# Patient Record
Sex: Male | Born: 1935 | Race: White | Hispanic: No | Marital: Married | Smoking: Never smoker
Health system: Southern US, Community
[De-identification: ages and names within clinical notes are randomized; demographics above are authoritative.]

## PROBLEM LIST (undated history)

## (undated) DIAGNOSIS — K219 Gastro-esophageal reflux disease without esophagitis: Secondary | ICD-10-CM

## (undated) DIAGNOSIS — E7800 Pure hypercholesterolemia, unspecified: Secondary | ICD-10-CM

## (undated) DIAGNOSIS — E119 Type 2 diabetes mellitus without complications: Secondary | ICD-10-CM

## (undated) DIAGNOSIS — I10 Essential (primary) hypertension: Secondary | ICD-10-CM

## (undated) DIAGNOSIS — N419 Inflammatory disease of prostate, unspecified: Secondary | ICD-10-CM

## (undated) HISTORY — PX: HERNIA REPAIR: SHX51

## (undated) HISTORY — PX: CHOLECYSTECTOMY: SHX55

---

## 2012-10-19 ENCOUNTER — Other Ambulatory Visit: Payer: Self-pay

## 2012-10-19 ENCOUNTER — Emergency Department (HOSPITAL_BASED_OUTPATIENT_CLINIC_OR_DEPARTMENT_OTHER)
Admission: EM | Admit: 2012-10-19 | Discharge: 2012-10-19 | Disposition: A | Discharge status: Home / Self Care | Payer: Medicare Other | Attending: Emergency Medicine | Admitting: Emergency Medicine

## 2012-10-19 ENCOUNTER — Encounter (HOSPITAL_BASED_OUTPATIENT_CLINIC_OR_DEPARTMENT_OTHER): Payer: Self-pay | Admitting: *Deleted

## 2012-10-19 ENCOUNTER — Emergency Department (HOSPITAL_BASED_OUTPATIENT_CLINIC_OR_DEPARTMENT_OTHER): Payer: Medicare Other

## 2012-10-19 DIAGNOSIS — E119 Type 2 diabetes mellitus without complications: Secondary | ICD-10-CM | POA: Insufficient documentation

## 2012-10-19 DIAGNOSIS — Z8669 Personal history of other diseases of the nervous system and sense organs: Secondary | ICD-10-CM | POA: Insufficient documentation

## 2012-10-19 DIAGNOSIS — Z79899 Other long term (current) drug therapy: Secondary | ICD-10-CM | POA: Insufficient documentation

## 2012-10-19 DIAGNOSIS — I10 Essential (primary) hypertension: Secondary | ICD-10-CM | POA: Insufficient documentation

## 2012-10-19 DIAGNOSIS — I4510 Unspecified right bundle-branch block: Secondary | ICD-10-CM | POA: Insufficient documentation

## 2012-10-19 DIAGNOSIS — E7800 Pure hypercholesterolemia, unspecified: Secondary | ICD-10-CM | POA: Insufficient documentation

## 2012-10-19 DIAGNOSIS — K219 Gastro-esophageal reflux disease without esophagitis: Secondary | ICD-10-CM | POA: Insufficient documentation

## 2012-10-19 DIAGNOSIS — R002 Palpitations: Secondary | ICD-10-CM | POA: Insufficient documentation

## 2012-10-19 DIAGNOSIS — Z87448 Personal history of other diseases of urinary system: Secondary | ICD-10-CM | POA: Insufficient documentation

## 2012-10-19 DIAGNOSIS — R42 Dizziness and giddiness: Secondary | ICD-10-CM | POA: Insufficient documentation

## 2012-10-19 HISTORY — DX: Type 2 diabetes mellitus without complications: E11.9

## 2012-10-19 HISTORY — DX: Gastro-esophageal reflux disease without esophagitis: K21.9

## 2012-10-19 HISTORY — DX: Essential (primary) hypertension: I10

## 2012-10-19 HISTORY — DX: Pure hypercholesterolemia, unspecified: E78.00

## 2012-10-19 HISTORY — DX: Inflammatory disease of prostate, unspecified: N41.9

## 2012-10-19 LAB — CBC WITH DIFFERENTIAL/PLATELET
Basophils Absolute: 0 10*3/uL (ref 0.0–0.1)
Basophils Relative: 0 % (ref 0–1)
Eosinophils Relative: 1 % (ref 0–5)
HCT: 40.5 % (ref 39.0–52.0)
Lymphocytes Relative: 43 % (ref 12–46)
MCHC: 35.3 g/dL (ref 30.0–36.0)
MCV: 85.1 fL (ref 78.0–100.0)
Monocytes Absolute: 0.8 10*3/uL (ref 0.1–1.0)
RDW: 13.6 % (ref 11.5–15.5)

## 2012-10-19 LAB — COMPREHENSIVE METABOLIC PANEL
Albumin: 3.7 g/dL (ref 3.5–5.2)
BUN: 27 mg/dL — ABNORMAL HIGH (ref 6–23)
Chloride: 100 mEq/L (ref 96–112)
Creatinine, Ser: 1.1 mg/dL (ref 0.50–1.35)
GFR calc Af Amer: 71 mL/min — ABNORMAL LOW (ref >90)
Glucose, Bld: 157 mg/dL — ABNORMAL HIGH (ref 70–99)
Total Bilirubin: 0.7 mg/dL (ref 0.3–1.2)

## 2012-10-19 MED ORDER — METOPROLOL SUCCINATE ER 25 MG PO TB24: 25.0000 mg | ORAL_TABLET | Freq: Every day | ORAL | Status: DC

## 2012-10-19 MED ORDER — METOPROLOL TARTRATE 50 MG PO TABS
25.0000 mg | ORAL_TABLET | Freq: Once | ORAL | Status: AC
  Administered 2012-10-19: 25 mg via ORAL
  Filled 2012-10-19: qty 1

## 2012-10-19 NOTE — ED Notes (Signed)
Pt states he has intermittent periods of palp since yest. Verified at pharmacy and told to have checked. Went to [facility] and told to come to ED. They came here last p.m., but we were full , so they left and it happened again today. No other s/s. Dizzy during episodes. Thirsty.

## 2012-10-19 NOTE — ED Provider Notes (Signed)
History    This chart was scribed for Nelia Shi, MD by Toya Smothers, ED Scribe. The patient was seen in room MH02/MH02. Patient's care was started at 1847.  CSN: [id number]  Arrival date & time 10/19/12  1847   First MD Initiated Contact with Patient 10/19/12 1904      Chief Complaint  Patient presents with  . Palpitations    The history is provided by the patient and the spouse. No language interpreter was used.    Jeremy Stuart is a 79 y.o. male with h/o HTN, hypercholesteremia, DM w/o complication, tinnitus, and vertigo, who presents to the Emergency Department complaining of 24 hours of gradual onset, intermittent episodes of moderate palpation. Pt denies pain. Typically healthy, CC is a deviation from baseline. Prior to onset yesterday, Pt denotes lightheadedness and feeling "panicky." Pt's recorded heart rate at 168 BPM via blood pressure monitor. Pt went to [facility] for verification and upon evaluation CC subsided. Pt was referred to the ED for further evaluation. 2 hours PTA today, Pt experienced a second episode with similar precursors and a heart rate of 168 BMP. Symptoms have not been treated PTA. No fever, chills, cough, congestion, rhinorrhea, chest pain, SOB, or n/v/d. Pt denies use of tobacco, alcohol, and illicit drug use.   Past Medical History  Diagnosis Date  . Hypertension   . Hypercholesteremia   . Diabetes mellitus without complication   . Acid reflux   . Prostate infection     Past Surgical History  Procedure Laterality Date  . Cholecystectomy    . Hernia repair      History reviewed. No pertinent family history.  History  Substance Use Topics  . Smoking status: Never Smoker   . Smokeless tobacco: Not on file  . Alcohol Use: No      Review of Systems  Cardiovascular: Positive for palpitations. Negative for chest pain.  Neurological: Positive for light-headedness.  All other systems reviewed and are negative.    Allergies   Ciprofloxacin; Penicillins; and Sulfa antibiotics  Home Medications   Current Outpatient Rx  Name  Route  Sig  Dispense  Refill  . calcium carbonate (OS-CAL) 600 MG TABS   Oral   Take 600 mg by mouth 2 (two) times daily with a meal.         . clonazePAM (KLONOPIN) 2 MG tablet   Oral   Take 2 mg by mouth 2 (two) times daily as needed for anxiety.         Marland Kitchen ezetimibe (ZETIA) 10 MG tablet   Oral   Take 10 mg by mouth daily.         . hydrochlorothiazide (HYDRODIURIL) 25 MG tablet   Oral   Take 25 mg by mouth daily.         Marland Kitchen lovastatin (MEVACOR) 20 MG tablet   Oral   Take 20 mg by mouth at bedtime.         . metFORMIN (GLUCOPHAGE) 500 MG tablet   Oral   Take 500 mg by mouth 2 (two) times daily with a meal.         . omeprazole (PRILOSEC) 10 MG capsule   Oral   Take 10 mg by mouth daily.         . metoprolol succinate (TOPROL-XL) 25 MG 24 hr tablet   Oral   Take 1 tablet (25 mg total) by mouth daily.   14 tablet   0  BP 132/79  Pulse 108  Temp(Src) 97.8 F (36.6 C) (Oral)  Resp 20  Ht 5\' 5"  (1.651 m)  Wt 156 lb (70.761 kg)  BMI 25.96 kg/m2  SpO2 100%  Physical Exam  Nursing note and vitals reviewed. Constitutional: He is oriented to person, place, and time. He appears well-developed and well-nourished. No distress.  HENT:  Head: Normocephalic and atraumatic.  Eyes: Pupils are equal, round, and reactive to light.  Neck: Normal range of motion.  Cardiovascular: Normal rate and intact distal pulses.   Pulmonary/Chest: No respiratory distress.  Abdominal: Normal appearance. He exhibits no distension.  Musculoskeletal: Normal range of motion.  Neurological: He is alert and oriented to person, place, and time. No cranial nerve deficit.  Skin: Skin is warm and dry. No rash noted.  Psychiatric: He has a normal mood and affect. His behavior is normal.    ED Course  Procedures DIAGNOSTIC STUDIES: Oxygen Saturation is 100% on room air, normal  by my interpretation.    COORDINATION OF CARE: 19:17- Evaluated Pt. Pt is awake, alert, and without distress. 19:20- Ordered CBC with Differential, Troponin I, and Comprehensive metabolic panel STAT. 19:20- Ordered Telemetry monitoring Until discontinued. 19:21- Ordered DG Chest 2 View 1 time imaging 19:25- Patient understand and agree with initial ED impression and plan with expectations set for ED visit.     Labs Reviewed  COMPREHENSIVE METABOLIC PANEL - Abnormal; Notable for the following:    Glucose, Bld 157 (*)    BUN 27 (*)    GFR calc non Af Amer 61 (*)    GFR calc Af Amer 71 (*)    All other components within normal limits  CBC WITH DIFFERENTIAL  TROPONIN I   Dg Chest 2 View  10/19/2012  *RADIOLOGY REPORT*  Clinical Data: Tachycardia.  CHEST - 2 VIEW  Comparison: None.  Findings: Mild lobulation of the right hemidiaphragm is likely incidental.  Atherosclerotic calcification of the aortic arch noted.  Heart size within normal limits.  Old right-sided rib fractures noted.  The lungs appear clear.  No pleural effusion.  IMPRESSION:  1.  No acute findings. 2.  Mild lobulation of the right hemidiaphragm, likely incidental.   Original Report Authenticated By: Gaylyn Rong, M.D.      1. Palpitation   2. RBBB       MDM  I personally performed the services described in this documentation, which was scribed in my presence. The recorded information has been reviewed and considered.   At time of discharge patient's heart rate was down to 75.  He was resting comfortably with no complaints.  Patient was given instructions to followup with his family doctor tomorrow for in order to set up a Holter or event monitor.   Nelia Shi, MD 10/19/12 2052

## 2014-12-13 ENCOUNTER — Inpatient Hospital Stay (HOSPITAL_BASED_OUTPATIENT_CLINIC_OR_DEPARTMENT_OTHER)
Admission: EM | Admit: 2014-12-13 | Discharge: 2014-12-16 | DRG: 872 | Disposition: A | Discharge status: Home / Self Care | Payer: Medicare Other | Attending: Internal Medicine | Admitting: Internal Medicine

## 2014-12-13 ENCOUNTER — Encounter (HOSPITAL_BASED_OUTPATIENT_CLINIC_OR_DEPARTMENT_OTHER): Payer: Self-pay | Admitting: *Deleted

## 2014-12-13 ENCOUNTER — Emergency Department (HOSPITAL_BASED_OUTPATIENT_CLINIC_OR_DEPARTMENT_OTHER): Payer: Medicare Other

## 2014-12-13 DIAGNOSIS — R7989 Other specified abnormal findings of blood chemistry: Secondary | ICD-10-CM | POA: Insufficient documentation

## 2014-12-13 DIAGNOSIS — R1031 Right lower quadrant pain: Secondary | ICD-10-CM

## 2014-12-13 DIAGNOSIS — E119 Type 2 diabetes mellitus without complications: Secondary | ICD-10-CM | POA: Diagnosis present

## 2014-12-13 DIAGNOSIS — E785 Hyperlipidemia, unspecified: Secondary | ICD-10-CM | POA: Diagnosis present

## 2014-12-13 DIAGNOSIS — E7800 Pure hypercholesterolemia, unspecified: Secondary | ICD-10-CM | POA: Diagnosis present

## 2014-12-13 DIAGNOSIS — K219 Gastro-esophageal reflux disease without esophagitis: Secondary | ICD-10-CM | POA: Diagnosis present

## 2014-12-13 DIAGNOSIS — I4510 Unspecified right bundle-branch block: Secondary | ICD-10-CM | POA: Diagnosis present

## 2014-12-13 DIAGNOSIS — E872 Acidosis: Secondary | ICD-10-CM | POA: Diagnosis present

## 2014-12-13 DIAGNOSIS — A419 Sepsis, unspecified organism: Principal | ICD-10-CM | POA: Diagnosis present

## 2014-12-13 DIAGNOSIS — R112 Nausea with vomiting, unspecified: Secondary | ICD-10-CM

## 2014-12-13 DIAGNOSIS — T502X5A Adverse effect of carbonic-anhydrase inhibitors, benzothiadiazides and other diuretics, initial encounter: Secondary | ICD-10-CM | POA: Diagnosis present

## 2014-12-13 DIAGNOSIS — R109 Unspecified abdominal pain: Secondary | ICD-10-CM | POA: Diagnosis present

## 2014-12-13 DIAGNOSIS — Z880 Allergy status to penicillin: Secondary | ICD-10-CM

## 2014-12-13 DIAGNOSIS — R52 Pain, unspecified: Secondary | ICD-10-CM

## 2014-12-13 DIAGNOSIS — E876 Hypokalemia: Secondary | ICD-10-CM | POA: Diagnosis present

## 2014-12-13 DIAGNOSIS — Z66 Do not resuscitate: Secondary | ICD-10-CM | POA: Diagnosis present

## 2014-12-13 DIAGNOSIS — I10 Essential (primary) hypertension: Secondary | ICD-10-CM | POA: Diagnosis present

## 2014-12-13 DIAGNOSIS — Z79899 Other long term (current) drug therapy: Secondary | ICD-10-CM

## 2014-12-13 DIAGNOSIS — K529 Noninfective gastroenteritis and colitis, unspecified: Secondary | ICD-10-CM | POA: Diagnosis present

## 2014-12-13 DIAGNOSIS — Z881 Allergy status to other antibiotic agents status: Secondary | ICD-10-CM

## 2014-12-13 LAB — CBC WITH DIFFERENTIAL/PLATELET
BASOS ABS: 0 10*3/uL (ref 0.0–0.1)
BASOS PCT: 0 % (ref 0–1)
EOS ABS: 0 10*3/uL (ref 0.0–0.7)
Eosinophils Relative: 0 % (ref 0–5)
HEMATOCRIT: 43.5 % (ref 39.0–52.0)
HEMOGLOBIN: 15.1 g/dL (ref 13.0–17.0)
LYMPHS ABS: 0.6 10*3/uL — AB (ref 0.7–4.0)
Lymphocytes Relative: 5 % — ABNORMAL LOW (ref 12–46)
MCH: 30 pg (ref 26.0–34.0)
MCHC: 34.7 g/dL (ref 30.0–36.0)
MCV: 86.5 fL (ref 78.0–100.0)
Monocytes Absolute: 0.6 10*3/uL (ref 0.1–1.0)
Monocytes Relative: 5 % (ref 3–12)
NEUTROS PCT: 90 % — AB (ref 43–77)
Neutro Abs: 10.5 10*3/uL — ABNORMAL HIGH (ref 1.7–7.7)
PLATELETS: 282 10*3/uL (ref 150–400)
RBC: 5.03 MIL/uL (ref 4.22–5.81)
RDW: 14 % (ref 11.5–15.5)
WBC: 11.8 10*3/uL — ABNORMAL HIGH (ref 4.0–10.5)

## 2014-12-13 LAB — HEPATIC FUNCTION PANEL
ALBUMIN: 4.2 g/dL (ref 3.5–5.0)
ALK PHOS: 51 U/L (ref 38–126)
ALT: 32 U/L (ref 17–63)
AST: 31 U/L (ref 15–41)
BILIRUBIN DIRECT: 0.3 mg/dL (ref 0.1–0.5)
BILIRUBIN TOTAL: 1.3 mg/dL — AB (ref 0.3–1.2)
Indirect Bilirubin: 1 mg/dL — ABNORMAL HIGH (ref 0.3–0.9)
Total Protein: 7.6 g/dL (ref 6.5–8.1)

## 2014-12-13 LAB — URINALYSIS, ROUTINE W REFLEX MICROSCOPIC
BILIRUBIN URINE: NEGATIVE
GLUCOSE, UA: NEGATIVE mg/dL
KETONES UR: 15 mg/dL — AB
Leukocytes, UA: NEGATIVE
Nitrite: NEGATIVE
PROTEIN: NEGATIVE mg/dL
Specific Gravity, Urine: 1.023 (ref 1.005–1.030)
UROBILINOGEN UA: 0.2 mg/dL (ref 0.0–1.0)
pH: 5 (ref 5.0–8.0)

## 2014-12-13 LAB — BASIC METABOLIC PANEL
Anion gap: 13 (ref 5–15)
BUN: 23 mg/dL — AB (ref 6–20)
CHLORIDE: 98 mmol/L — AB (ref 101–111)
CO2: 25 mmol/L (ref 22–32)
CREATININE: 1.05 mg/dL (ref 0.61–1.24)
Calcium: 9.5 mg/dL (ref 8.9–10.3)
GFR calc non Af Amer: >60 mL/min (ref >60)
Glucose, Bld: 169 mg/dL — ABNORMAL HIGH (ref 65–99)
POTASSIUM: 3.4 mmol/L — AB (ref 3.5–5.1)
SODIUM: 136 mmol/L (ref 135–145)

## 2014-12-13 LAB — I-STAT CG4 LACTIC ACID, ED: LACTIC ACID, VENOUS: 3.11 mmol/L — AB (ref 0.5–2.0)

## 2014-12-13 LAB — TROPONIN I: Troponin I: 0.03 ng/mL (ref <0.031)

## 2014-12-13 LAB — LIPASE, BLOOD: LIPASE: 29 U/L (ref 22–51)

## 2014-12-13 LAB — URINE MICROSCOPIC-ADD ON

## 2014-12-13 MED ORDER — SODIUM CHLORIDE 0.9 % IV BOLUS (SEPSIS)
500.0000 mL | Freq: Once | INTRAVENOUS | Status: AC
  Administered 2014-12-13: 500 mL via INTRAVENOUS

## 2014-12-13 MED ORDER — IOHEXOL 300 MG/ML  SOLN
25.0000 mL | Freq: Once | INTRAMUSCULAR | Status: AC | PRN
  Administered 2014-12-13: 25 mL via ORAL

## 2014-12-13 MED ORDER — SODIUM CHLORIDE 0.9 % IV BOLUS (SEPSIS)
30.0000 mL/kg | Freq: Once | INTRAVENOUS | Status: AC
  Administered 2014-12-14: 2274 mL via INTRAVENOUS

## 2014-12-13 MED ORDER — METRONIDAZOLE IN NACL 5-0.79 MG/ML-% IV SOLN
500.0000 mg | Freq: Once | INTRAVENOUS | Status: AC
  Administered 2014-12-13: 500 mg via INTRAVENOUS
  Filled 2014-12-13: qty 100

## 2014-12-13 MED ORDER — IOHEXOL 300 MG/ML  SOLN
100.0000 mL | Freq: Once | INTRAMUSCULAR | Status: AC | PRN
  Administered 2014-12-13: 100 mL via INTRAVENOUS

## 2014-12-13 MED ORDER — ONDANSETRON 4 MG PO TBDP
4.0000 mg | ORAL_TABLET | Freq: Once | ORAL | Status: AC
  Administered 2014-12-13: 4 mg via ORAL
  Filled 2014-12-13: qty 1

## 2014-12-13 MED ORDER — SODIUM CHLORIDE 0.9 % IV SOLN
INTRAVENOUS | Status: AC
  Administered 2014-12-14: 05:00:00 via INTRAVENOUS

## 2014-12-13 NOTE — ED Notes (Signed)
Pt vomited approx 500cc emesis with undigested food- New emesis bag given- ODT zofran ordered per protocol

## 2014-12-13 NOTE — ED Provider Notes (Signed)
CSN: [id number]     Arrival date & time 12/13/14  1839 History   This chart was scribed for Arby BarretteMarcy Ishaq Maffei, MD by Abel PrestoKara Demonbreun, ED Scribe. This patient was seen in room MH05/MH05 and the patient's care was started at 8:05 PM.     Chief Complaint  Patient presents with  . Abdominal Pain     The history is provided by the patient. No language interpreter was used.   HPI Comments: Jeremy Lyonshomas Gangi is a 81 y.o. male with PMHx of HTN, HLD, DM, GERD,  presents to the Emergency Department complaining of periumbilical abdominal pain radiating to right side which has partially resolved with onset around 12 PM. Pt states he at a PB&J sandwich, Coke, and protein bar around 12:30 PM and took a Tylenol. Pt then took 1/2 of a Vicodin with mild relief. He also took 2 TUMS. Pt had kidney stone surgery in March (he denies any complications since). Pt notes associated nausea and vomiting once. He reports the pain started somewhat gradually and then just continued to increase. Now after having vomited his abdominal pain has resolved. He denies he had diarrhea or has constipation. He denies pain with movement.  Pt with h/o cholecystectomy. Pt denies swelling in bilateral LEs, fever, testicular pain, dysuria, and back pain.   Past Medical History  Diagnosis Date  . Hypertension   . Hypercholesteremia   . Diabetes mellitus without complication   . Acid reflux   . Prostate infection    Past Surgical History  Procedure Laterality Date  . Cholecystectomy    . Hernia repair     History reviewed. No pertinent family history. History  Substance Use Topics  . Smoking status: Never Smoker   . Smokeless tobacco: Not on file  . Alcohol Use: No    Review of Systems  Gastrointestinal: Positive for abdominal pain.  10 Systems reviewed and all are negative for acute change except as noted in the HPI.      Allergies  Ciprofloxacin; Penicillins; and Sulfa antibiotics  Home Medications   Prior to Admission  medications   Medication Sig Start Date End Date Taking? Authorizing Provider  calcium carbonate (OS-CAL) 600 MG TABS Take 600 mg by mouth 2 (two) times daily with a meal.    Historical Provider, MD  clonazePAM (KLONOPIN) 2 MG tablet Take 2 mg by mouth 2 (two) times daily as needed for anxiety.    Historical Provider, MD  ezetimibe (ZETIA) 10 MG tablet Take 10 mg by mouth daily.    Historical Provider, MD  hydrochlorothiazide (HYDRODIURIL) 25 MG tablet Take 25 mg by mouth daily.    Historical Provider, MD  lovastatin (MEVACOR) 20 MG tablet Take 20 mg by mouth at bedtime.    Historical Provider, MD  metFORMIN (GLUCOPHAGE) 500 MG tablet Take 500 mg by mouth 2 (two) times daily with a meal.    Historical Provider, MD  metoprolol succinate (TOPROL-XL) 25 MG 24 hr tablet Take 1 tablet (25 mg total) by mouth daily. 10/19/12   Nelva Nayobert Beaton, MD  omeprazole (PRILOSEC) 10 MG capsule Take 10 mg by mouth daily.    Historical Provider, MD   BP 139/73 mmHg  Pulse 108  Temp(Src) 101.1 F (38.4 C) (Oral)  Resp 18  Ht 5\' 4"  (1.626 m)  Wt 167 lb (75.751 kg)  BMI 28.65 kg/m2  SpO2 99% Physical Exam  Constitutional: He is oriented to person, place, and time. He appears well-developed and well-nourished.  HENT:  Head: Normocephalic and  atraumatic.  Eyes: EOM are normal. Pupils are equal, round, and reactive to light.  Neck: Neck supple.  Cardiovascular: Normal rate, regular rhythm, normal heart sounds and intact distal pulses.   Pulmonary/Chest: Effort normal and breath sounds normal.  Abdominal: Soft. Bowel sounds are normal. He exhibits no distension. There is no tenderness.  Musculoskeletal: Normal range of motion. He exhibits no edema.  Neurological: He is alert and oriented to person, place, and time. He has normal strength. Coordination normal. GCS eye subscore is 4. GCS verbal subscore is 5. GCS motor subscore is 6.  Skin: Skin is warm, dry and intact.  Psychiatric: He has a normal mood and affect.     ED Course  Procedures (including critical care time) DIAGNOSTIC STUDIES: Oxygen Saturation is 100% on room air, normal by my interpretation.    COORDINATION OF CARE: 8:12 PM Discussed treatment plan with patient at beside, the patient agrees with the plan and has no further questions at this time.   Labs Review Labs Reviewed  CBC WITH DIFFERENTIAL/PLATELET - Abnormal; Notable for the following:    WBC 11.8 (*)    Neutrophils Relative % 90 (*)    Neutro Abs 10.5 (*)    Lymphocytes Relative 5 (*)    Lymphs Abs 0.6 (*)    All other components within normal limits  BASIC METABOLIC PANEL - Abnormal; Notable for the following:    Potassium 3.4 (*)    Chloride 98 (*)    Glucose, Bld 169 (*)    BUN 23 (*)    All other components within normal limits  HEPATIC FUNCTION PANEL - Abnormal; Notable for the following:    Total Bilirubin 1.3 (*)    Indirect Bilirubin 1.0 (*)    All other components within normal limits  URINALYSIS, ROUTINE W REFLEX MICROSCOPIC - Abnormal; Notable for the following:    Hgb urine dipstick TRACE (*)    Ketones, ur 15 (*)    All other components within normal limits  I-STAT CG4 LACTIC ACID, ED - Abnormal; Notable for the following:    Lactic Acid, Venous 3.11 (*)    All other components within normal limits  LIPASE, BLOOD  TROPONIN I  URINE MICROSCOPIC-ADD ON  LACTIC ACID, PLASMA  LACTIC ACID, PLASMA    Imaging Review Ct Abdomen Pelvis W Contrast  12/13/2014   CLINICAL DATA:  Diffuse abdominal pain. Nausea and vomiting for 6 hours. Cholecystectomy.  EXAM: CT ABDOMEN AND PELVIS WITH CONTRAST  TECHNIQUE: Multidetector CT imaging of the abdomen and pelvis was performed using the standard protocol following bolus administration of intravenous contrast.  CONTRAST:  25mL OMNIPAQUE IOHEXOL 300 MG/ML SOLN, OMNIPAQUE IOHEXOL 300 MG/ML SOLN  COMPARISON:  06/18/2014.  FINDINGS: Musculoskeletal: Chronic L1 compression fracture. No new compression fractures  compared to prior exam. Lower lumbar spondylosis. Likely chronic T8 compression fracture.  Lung Bases: Pleural nodularity is present in the RIGHT lower lobe. 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 No comparison chest CT is available and these regions were not seen on previous abdominal CT. Coronary artery atherosclerosis is present. If office based assessment of coronary risk factors has not been performed, it is now recommended.  Liver:  Normal.  Spleen:  Normal.  Gallbladder:  Cholecystectomy.  Common bile duct:  Normal.  Pancreas:  Normal.  Adrenal glands:  Unchanged LEFT adrenal myelolipoma.  Kidneys: Renal enhancement and excretion is within normal limits. LEFT ureter normal. RIGHT ureter normal.  Stomach:  Distended with  contrast.  No thickening or obstruction.  Small bowel:  Within normal limits.  No dilation.  Colon: Fluid levels are present throughout the colon. These are abnormal but nonspecific. No colonic obstruction. No definite mural thickening.  Pelvic Genitourinary: Penile prosthesis reservoir in the abdomen. Urinary bladder appears normal.  Peritoneum: No free air.  No free fluid.  Vascular/lymphatic: Atherosclerosis.  Body Wall: Tiny fat containing periumbilical hernia.  IMPRESSION: 1. Areas of RIGHT pleural thickening, suggestive of pleural plaques but incompletely visible. 2. Atherosclerosis and coronary artery disease. 3. Nonspecific fluid levels throughout the colon. These are most commonly associated with enteric infection. 4. Unchanged LEFT adrenal myelolipoma.   Electronically Signed   By: Andreas NewportGeoffrey  Lamke M.D.   On: 12/13/2014 21:27     EKG Interpretation   Date/Time:  Monday Dec 13 2014 20:49:03 EDT Ventricular Rate:  94 PR Interval:  162 QRS Duration: 118 QT Interval:  426 QTC Calculation: 532 R Axis:   -6 Text Interpretation:  Normal sinus rhythm Possible Left atrial enlargement  Right bundle branch block Inferior infarct , age  undetermined T wave  abnormality, consider lateral ischemia Abnormal ECG old RBBB, baseline  artifact Confirmed by Donnald GarrePfeiffer, MD, Lebron ConnersMarcy [phone]([id number]) on 12/13/2014 9:46:22 PM     8:12 PM Pt reports pain has improved post-emesis.  After initial discussion for admission the patient had [facility] Due to bed unavailability, the patient was ultimately consults [facility]   Final diagnoses:  Right lower quadrant abdominal pain  Non-intractable vomiting with nausea, vomiting of unspecified type    Patient has developed quite rapid onset of abdominal pain and vomiting. After having had an episode of emesis the patient reported the pain as having resolved. CT does however show multiple fluid levels and the patient had an elevated lactic acidosis. With the patient's age and tachycardia, concern was for possible early presentation for more serious intra-abdominal process. The patient later developed a fever further raising concern for possible bacterial etiology. The patient will be admitted for ongoing treatment.    Arby BarretteMarcy Murle Otting, MD 12/13/14 [phone]

## 2014-12-13 NOTE — ED Notes (Signed)
Pt c/o diffuse abd pain with n/v/d x 6 hrs

## 2014-12-13 NOTE — ED Notes (Signed)
LAC ran with panic results of 3.11. Results hand delivered to Dr. Donnald GarrePfeiffer @ 2030.

## 2014-12-14 ENCOUNTER — Encounter (HOSPITAL_COMMUNITY): Payer: Self-pay | Admitting: Internal Medicine

## 2014-12-14 DIAGNOSIS — E119 Type 2 diabetes mellitus without complications: Secondary | ICD-10-CM

## 2014-12-14 DIAGNOSIS — R1031 Right lower quadrant pain: Secondary | ICD-10-CM | POA: Diagnosis not present

## 2014-12-14 DIAGNOSIS — K210 Gastro-esophageal reflux disease with esophagitis: Secondary | ICD-10-CM

## 2014-12-14 DIAGNOSIS — E785 Hyperlipidemia, unspecified: Secondary | ICD-10-CM | POA: Diagnosis present

## 2014-12-14 DIAGNOSIS — I10 Essential (primary) hypertension: Secondary | ICD-10-CM | POA: Diagnosis present

## 2014-12-14 DIAGNOSIS — K219 Gastro-esophageal reflux disease without esophagitis: Secondary | ICD-10-CM | POA: Diagnosis present

## 2014-12-14 DIAGNOSIS — E872 Acidosis: Secondary | ICD-10-CM | POA: Diagnosis present

## 2014-12-14 DIAGNOSIS — R109 Unspecified abdominal pain: Secondary | ICD-10-CM | POA: Diagnosis not present

## 2014-12-14 DIAGNOSIS — E876 Hypokalemia: Secondary | ICD-10-CM | POA: Diagnosis present

## 2014-12-14 DIAGNOSIS — E7800 Pure hypercholesterolemia, unspecified: Secondary | ICD-10-CM | POA: Diagnosis present

## 2014-12-14 DIAGNOSIS — Z880 Allergy status to penicillin: Secondary | ICD-10-CM | POA: Diagnosis not present

## 2014-12-14 DIAGNOSIS — A419 Sepsis, unspecified organism: Secondary | ICD-10-CM | POA: Diagnosis present

## 2014-12-14 DIAGNOSIS — Z881 Allergy status to other antibiotic agents status: Secondary | ICD-10-CM | POA: Diagnosis not present

## 2014-12-14 DIAGNOSIS — K529 Noninfective gastroenteritis and colitis, unspecified: Secondary | ICD-10-CM | POA: Diagnosis present

## 2014-12-14 DIAGNOSIS — Z66 Do not resuscitate: Secondary | ICD-10-CM | POA: Diagnosis present

## 2014-12-14 DIAGNOSIS — Z79899 Other long term (current) drug therapy: Secondary | ICD-10-CM | POA: Diagnosis not present

## 2014-12-14 DIAGNOSIS — I4510 Unspecified right bundle-branch block: Secondary | ICD-10-CM | POA: Diagnosis present

## 2014-12-14 DIAGNOSIS — T502X5A Adverse effect of carbonic-anhydrase inhibitors, benzothiadiazides and other diuretics, initial encounter: Secondary | ICD-10-CM | POA: Diagnosis present

## 2014-12-14 LAB — CBC
HCT: 36.5 % — ABNORMAL LOW (ref 39.0–52.0)
Hemoglobin: 12.3 g/dL — ABNORMAL LOW (ref 13.0–17.0)
MCH: 29.2 pg (ref 26.0–34.0)
MCHC: 33.7 g/dL (ref 30.0–36.0)
MCV: 86.7 fL (ref 78.0–100.0)
Platelets: 230 10*3/uL (ref 150–400)
RBC: 4.21 MIL/uL — ABNORMAL LOW (ref 4.22–5.81)
RDW: 14.2 % (ref 11.5–15.5)
WBC: 6.4 10*3/uL (ref 4.0–10.5)

## 2014-12-14 LAB — COMPREHENSIVE METABOLIC PANEL
ALK PHOS: 38 U/L (ref 38–126)
ALT: 24 U/L (ref 17–63)
AST: 22 U/L (ref 15–41)
Albumin: 2.7 g/dL — ABNORMAL LOW (ref 3.5–5.0)
Anion gap: 8 (ref 5–15)
BUN: 15 mg/dL (ref 6–20)
CHLORIDE: 104 mmol/L (ref 101–111)
CO2: 25 mmol/L (ref 22–32)
Calcium: 7.8 mg/dL — ABNORMAL LOW (ref 8.9–10.3)
Creatinine, Ser: 1.03 mg/dL (ref 0.61–1.24)
GFR calc non Af Amer: >60 mL/min (ref >60)
GLUCOSE: 136 mg/dL — AB (ref 65–99)
POTASSIUM: 3.5 mmol/L (ref 3.5–5.1)
Sodium: 137 mmol/L (ref 135–145)
Total Bilirubin: 1.1 mg/dL (ref 0.3–1.2)
Total Protein: 5.1 g/dL — ABNORMAL LOW (ref 6.5–8.1)

## 2014-12-14 LAB — GLUCOSE, CAPILLARY
GLUCOSE-CAPILLARY: 125 mg/dL — AB (ref 65–99)
GLUCOSE-CAPILLARY: 135 mg/dL — AB (ref 65–99)
Glucose-Capillary: 117 mg/dL — ABNORMAL HIGH (ref 65–99)

## 2014-12-14 LAB — PROTIME-INR
INR: 1.11 (ref 0.00–1.49)
Prothrombin Time: 14.4 seconds (ref 11.6–15.2)

## 2014-12-14 LAB — LACTIC ACID, PLASMA
Lactic Acid, Venous: 1.5 mmol/L (ref 0.5–2.0)
Lactic Acid, Venous: 1.5 mmol/L (ref 0.5–2.0)

## 2014-12-14 LAB — CLOSTRIDIUM DIFFICILE BY PCR: Toxigenic C. Difficile by PCR: NEGATIVE

## 2014-12-14 LAB — PROCALCITONIN: Procalcitonin: 0.27 ng/mL

## 2014-12-14 LAB — I-STAT CG4 LACTIC ACID, ED: Lactic Acid, Venous: 1.7 mmol/L (ref 0.5–2.0)

## 2014-12-14 LAB — APTT: APTT: 30 s (ref 24–37)

## 2014-12-14 MED ORDER — ACETAMINOPHEN 325 MG PO TABS
ORAL_TABLET | ORAL | Status: AC
  Administered 2014-12-14: 650 mg
  Filled 2014-12-14: qty 2

## 2014-12-14 MED ORDER — CLONAZEPAM 1 MG PO TABS: 2.0000 mg | ORAL_TABLET | Freq: Two times a day (BID) | ORAL | Status: DC | PRN

## 2014-12-14 MED ORDER — EZETIMIBE 10 MG PO TABS
10.0000 mg | ORAL_TABLET | Freq: Every day | ORAL | Status: DC
  Administered 2014-12-14 – 2014-12-16 (×3): 10 mg via ORAL
  Filled 2014-12-14 (×3): qty 1

## 2014-12-14 MED ORDER — POTASSIUM CHLORIDE 10 MEQ/100ML IV SOLN
10.0000 meq | INTRAVENOUS | Status: AC
  Administered 2014-12-14 (×2): 10 meq via INTRAVENOUS
  Filled 2014-12-14 (×2): qty 100

## 2014-12-14 MED ORDER — ALUM & MAG HYDROXIDE-SIMETH 200-200-20 MG/5ML PO SUSP
30.0000 mL | Freq: Four times a day (QID) | ORAL | Status: DC | PRN
  Filled 2014-12-14: qty 30

## 2014-12-14 MED ORDER — MORPHINE SULFATE 2 MG/ML IJ SOLN
2.0000 mg | INTRAMUSCULAR | Status: DC | PRN
  Administered 2014-12-14: 2 mg via INTRAVENOUS
  Filled 2014-12-14: qty 1

## 2014-12-14 MED ORDER — ACETAMINOPHEN 325 MG PO TABS: 650.0000 mg | ORAL_TABLET | Freq: Four times a day (QID) | ORAL | Status: DC | PRN

## 2014-12-14 MED ORDER — MORPHINE SULFATE 4 MG/ML IJ SOLN
4.0000 mg | Freq: Once | INTRAMUSCULAR | Status: AC
  Administered 2014-12-14: 4 mg via INTRAVENOUS
  Filled 2014-12-14: qty 1

## 2014-12-14 MED ORDER — ONDANSETRON 4 MG PO TBDP
4.0000 mg | ORAL_TABLET | Freq: Three times a day (TID) | ORAL | Status: DC | PRN
  Administered 2014-12-14: 4 mg via ORAL
  Filled 2014-12-14 (×5): qty 1

## 2014-12-14 MED ORDER — SODIUM CHLORIDE 0.9 % IV SOLN
250.0000 mg | Freq: Four times a day (QID) | INTRAVENOUS | Status: DC
  Administered 2014-12-14 – 2014-12-15 (×6): 250 mg via INTRAVENOUS
  Filled 2014-12-14 (×10): qty 250

## 2014-12-14 MED ORDER — HEPARIN SODIUM (PORCINE) 5000 UNIT/ML IJ SOLN
5000.0000 [IU] | Freq: Three times a day (TID) | INTRAMUSCULAR | Status: DC
  Administered 2014-12-14 – 2014-12-16 (×7): 5000 [IU] via SUBCUTANEOUS
  Filled 2014-12-14 (×10): qty 1

## 2014-12-14 MED ORDER — PRAVASTATIN SODIUM 20 MG PO TABS
20.0000 mg | ORAL_TABLET | Freq: Every day | ORAL | Status: DC
  Administered 2014-12-14 – 2014-12-15 (×2): 20 mg via ORAL
  Filled 2014-12-14 (×3): qty 1

## 2014-12-14 MED ORDER — FAMOTIDINE 20 MG PO TABS
20.0000 mg | ORAL_TABLET | Freq: Every day | ORAL | Status: DC
  Administered 2014-12-14 – 2014-12-16 (×3): 20 mg via ORAL
  Filled 2014-12-14 (×3): qty 1

## 2014-12-14 MED ORDER — SODIUM CHLORIDE 0.9 % IV SOLN
500.0000 mg | Freq: Once | INTRAVENOUS | Status: AC
  Administered 2014-12-14: 500 mg via INTRAVENOUS
  Filled 2014-12-14: qty 500

## 2014-12-14 MED ORDER — SODIUM CHLORIDE 0.9 % IV SOLN
INTRAVENOUS | Status: DC
  Administered 2014-12-15 (×2): via INTRAVENOUS

## 2014-12-14 MED ORDER — METOPROLOL SUCCINATE ER 25 MG PO TB24
25.0000 mg | ORAL_TABLET | Freq: Every day | ORAL | Status: DC
  Administered 2014-12-14 – 2014-12-16 (×3): 25 mg via ORAL
  Filled 2014-12-14 (×3): qty 1

## 2014-12-14 MED ORDER — CALCIUM CARBONATE 1250 (500 CA) MG PO TABS
1250.0000 mg | ORAL_TABLET | Freq: Two times a day (BID) | ORAL | Status: DC
  Administered 2014-12-14 – 2014-12-16 (×5): 1250 mg via ORAL
  Filled 2014-12-14 (×7): qty 1

## 2014-12-14 MED ORDER — ACETAMINOPHEN 650 MG RE SUPP: 650.0000 mg | Freq: Four times a day (QID) | RECTAL | Status: DC | PRN

## 2014-12-14 MED ORDER — METRONIDAZOLE IN NACL 5-0.79 MG/ML-% IV SOLN
500.0000 mg | Freq: Three times a day (TID) | INTRAVENOUS | Status: DC
  Administered 2014-12-14 – 2014-12-15 (×4): 500 mg via INTRAVENOUS
  Filled 2014-12-14 (×7): qty 100

## 2014-12-14 NOTE — Progress Notes (Signed)
[facility] PROGRESS NOTE   Jeremy Stuart GNF:[id number] DOB: [birth year] DOA: 12/13/2014 PCP: No primary care provider on file.  HPI/Subjective: Patient seen with wife at bedside, complaining about being hungry, wants to eat. Denies any nausea or vomiting.  Assessment/Plan: Principal Problem:   Abdominal pain Active Problems:   Hypertension   Hypercholesteremia   Diabetes mellitus without complication   Acid reflux   Sepsis   GERD (gastroesophageal reflux disease)   Hypokalemia   Right lower quadrant abdominal pain   This is a no charge note, patient seen earlier today by my colleague Dr. Clyde LundborgNiu. Patient came into the hospital with nausea, vomiting, abdominal pain and fever. Symptoms consistent with sepsis from likely abdominal process. CT scan showed possible enteritis, placed nothing by mouth but he is hungry, will allow regular diet. Patient is on antibiotics, leukocytosis resolved, if tolerated food during continued to improve without nausea vomiting expected discharge in the morning. Currently on Primaxin and Flagyl  Code Status: DNR Family Communication: Plan discussed with the patient. Disposition Plan: Remains inpatient Diet: Diet Carb Modified Fluid consistency:: Thin; Room service appropriate?: Yes  Consultants:  None  Procedures:  None  Antibiotics:  Primaxin and Flagyl   Objective: Filed Vitals:   12/14/14 1541  BP: 116/55  Pulse:   Temp: 98.2 F (36.8 C)  Resp: 16    Intake/Output Summary (Last 24 hours) at 12/14/14 1608 Last data filed at 12/14/14 1457  Gross per 24 hour  Intake 1435.42 ml  Output   1100 ml  Net 335.42 ml   Filed Weights   12/13/14 1842 12/14/14 0306  Weight: 75.751 kg (167 lb) 68.856 kg (151 lb 12.8 oz)    Exam: General: Alert and awake, oriented x3, not in any acute distress. HEENT: anicteric sclera, pupils reactive to light and accommodation, EOMI CVS: S1-S2 clear, no murmur rubs or gallops Chest: clear  to auscultation bilaterally, no wheezing, rales or rhonchi Abdomen: soft nontender, nondistended, normal bowel sounds, no organomegaly Extremities: no cyanosis, clubbing or edema noted bilaterally Neuro: Cranial nerves II-XII intact, no focal neurological deficits  Data Reviewed: Basic Metabolic Panel:  Recent Labs Lab 12/13/14 1950 12/14/14 0452  NA 136 137  K 3.4* 3.5  CL 98* 104  CO2 25 25  GLUCOSE 169* 136*  BUN 23* 15  CREATININE 1.05 1.03  CALCIUM 9.5 7.8*   Liver Function Tests:  Recent Labs Lab 12/13/14 1950 12/14/14 0452  AST 31 22  ALT 32 24  ALKPHOS 51 38  BILITOT 1.3* 1.1  PROT 7.6 5.1*  ALBUMIN 4.2 2.7*    Recent Labs Lab 12/13/14 1950  LIPASE 29   No results for input(s): AMMONIA in the last 168 hours. CBC:  Recent Labs Lab 12/13/14 1950 12/14/14 0452  WBC 11.8* 6.4  NEUTROABS 10.5*  --   HGB 15.1 12.3*  HCT 43.5 36.5*  MCV 86.5 86.7  PLT 282 230   Cardiac Enzymes:  Recent Labs Lab 12/13/14 1950  TROPONINI 0.03   BNP (last 3 results) No results for input(s): BNP in the last 8760 hours.  ProBNP (last 3 results) No results for input(s): PROBNP in the last 8760 hours.  CBG:  Recent Labs Lab 12/14/14 0318 12/14/14 0512 12/14/14 0809  GLUCAP 125* 117* 135*    Micro Recent Results (from the past 240 hour(s))  Clostridium Difficile by PCR     Status: None   Collection Time: 12/14/14  6:29 AM  Result Value Ref Range Status   C difficile by  pcr NEGATIVE NEGATIVE Final     Studies: Ct Abdomen Pelvis W Contrast  12/13/2014   CLINICAL DATA:  Diffuse abdominal pain. Nausea and vomiting for 6 hours. Cholecystectomy.  EXAM: CT ABDOMEN AND PELVIS WITH CONTRAST  TECHNIQUE: Multidetector CT imaging of the abdomen and pelvis was performed using the standard protocol following bolus administration of intravenous contrast.  CONTRAST:  25mL OMNIPAQUE IOHEXOL 300 MG/ML SOLN, 100mL OMNIPAQUE IOHEXOL 300 MG/ML SOLN  COMPARISON:  06/18/2014.   FINDINGS: Musculoskeletal: Chronic L1 compression fracture. No new compression fractures compared to prior exam. Lower lumbar spondylosis. Likely chronic T8 compression fracture.  Lung Bases: Pleural nodularity is present in the RIGHT lower lobe. 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 No comparison chest CT is available and these regions were not seen on previous abdominal CT. Coronary artery atherosclerosis is present. If office based assessment of coronary risk factors has not been performed, it is now recommended.  Liver:  Normal.  Spleen:  Normal.  Gallbladder:  Cholecystectomy.  Common bile duct:  Normal.  Pancreas:  Normal.  Adrenal glands:  Unchanged LEFT adrenal myelolipoma.  Kidneys: Renal enhancement and excretion is within normal limits. LEFT ureter normal. RIGHT ureter normal.  Stomach:  Distended with contrast.  No thickening or obstruction.  Small bowel:  Within normal limits.  No dilation.  Colon: Fluid levels are present throughout the colon. These are abnormal but nonspecific. No colonic obstruction. No definite mural thickening.  Pelvic Genitourinary: Penile prosthesis reservoir in the abdomen. Urinary bladder appears normal.  Peritoneum: No free air.  No free fluid.  Vascular/lymphatic: Atherosclerosis.  Body Wall: Tiny fat containing periumbilical hernia.  IMPRESSION: 1. Areas of RIGHT pleural thickening, suggestive of pleural plaques but incompletely visible. 2. Atherosclerosis and coronary artery disease. 3. Nonspecific fluid levels throughout the colon. These are most commonly associated with enteric infection. 4. Unchanged LEFT adrenal myelolipoma.   Electronically Signed   By: Andreas NewportGeoffrey  Lamke M.D.   On: 12/13/2014 21:27    Scheduled Meds: . calcium carbonate  1,250 mg Oral BID WC  . ezetimibe  10 mg Oral Daily  . famotidine  20 mg Oral Daily  . heparin  5,000 Units Subcutaneous 3 times per day  . imipenem-cilastatin  250 mg Intravenous  Q6H  . metoprolol succinate  25 mg Oral Daily  . metronidazole  500 mg Intravenous Q8H  . pravastatin  20 mg Oral q1800   Continuous Infusions: . sodium chloride 75 mL/hr at 12/14/14 1457       Time spent: 35 minutes    [facility]ic[facility] LLCELMAHI,Lynniah Janoski A  [facility] Pager [phone] If 7PM-7AM, please contact night-coverage at [email or web address], password [email or web address] 12/14/2014, 4:08 PM  LOS: 0 days

## 2014-12-14 NOTE — Progress Notes (Signed)
Utilization Review completed. Leigh Blas RN BSN CM 

## 2014-12-14 NOTE — H&P (Signed)
[facility] History and Physical  Jeremy Lyonshomas Freels UJW:[id number] DOB: 23-Aug-1929 DOA: 12/13/2014  Referring physician: ED physician PCP: No primary care provider on file.  Specialists:   Chief Complaint: Abdominal pain, nausea, vomiting, diarrhea    HPI: Jeremy Stuart is a 81 y.o. male with PMH of hypertension, hyperlipidemia, diabetes mellitus, GERD, history of prostatitis, who presents with abdominal pain, nausea, vomiting and diarrhea.  Patient reports that he started having abdominal pain at 12:30 p.m. It is located in the right lower quadrant, constant, moderate pain, nonradiating. It is associated with fever and chills. He had vomited 3 times. He had 2 bowel movements with loose stool. He denies recent antibiotics use. No symptoms of UTI. He took a Tylenol and then 1/2 of a Vicodin with only slight relief.  Currently patient denies running nose, ear pain, headaches, cough, chest pain, SOB, dysuria, urgency, frequency, hematuria, skin rashes, joint pain or leg swelling. No unilateral weakness, numbness or tingling sensations. No vision change or hearing loss.  In ED, patient was found to have WBC 11.8, fever with temperature 101.5, tachycardia. Potassium 3.4, renal function okay, negative lipase, negative urinalysis, initial lactate is 3.11. CT-abd/pelvis showed nonspecific fluid levels throughout the colo, which is most commonly associated with enteric infection and unchanged LEFT adrenal myelolipoma. Patient is admitted to inpatient for further evaluation and treatment.  Where does patient live?      At home    Can patient participate in ADLs? some Review of Systems:   General: no fevers, chills, no changes in body weight, poor appetite, has fatigue HEENT: no blurry vision, hearing changes or sore throat Pulm: no dyspnea, coughing, wheezing CV: no chest pain, palpitations, shortness of breath Abd: has nausea, vomiting, abdominal pain, diarrhea. No constipation GU: no dysuria,  hematuria, polyuria Ext: no leg edema Neuro: no unilateral weakness, numbness, or tingling Skin: no rash MSK: No muscle spasm, no deformity, no limitation of range of movement in spin Heme: No easy bruising.  Travel history: No recent long distant travel.  Allergy:  Allergies  Allergen Reactions  . Ciprofloxacin Anaphylaxis  . Penicillins Anaphylaxis  . Sulfa Antibiotics Anaphylaxis    Past Medical History  Diagnosis Date  . Hypertension   . Hypercholesteremia   . Diabetes mellitus without complication   . Acid reflux   . Prostate infection     Past Surgical History  Procedure Laterality Date  . Cholecystectomy    . Hernia repair      Social History:  reports that he has never smoked. He does not have any smokeless tobacco history on file. He reports that he does not drink alcohol or use illicit drugs.  Family History:  Family History  Problem Relation Age of Onset  . Leukemia Sister      Prior to Admission medications   Medication Sig Start Date End Date Taking? Authorizing Provider  calcium carbonate (OS-CAL) 600 MG TABS Take 600 mg by mouth 2 (two) times daily with a meal.    Historical Provider, MD  clonazePAM (KLONOPIN) 2 MG tablet Take 2 mg by mouth 2 (two) times daily as needed for anxiety.    Historical Provider, MD  ezetimibe (ZETIA) 10 MG tablet Take 10 mg by mouth daily.    Historical Provider, MD  hydrochlorothiazide (HYDRODIURIL) 25 MG tablet Take 25 mg by mouth daily.    Historical Provider, MD  lovastatin (MEVACOR) 20 MG tablet Take 20 mg by mouth at bedtime.    Historical Provider, MD  metFORMIN (GLUCOPHAGE) 500  MG tablet Take 500 mg by mouth 2 (two) times daily with a meal.    Historical Provider, MD  metoprolol succinate (TOPROL-XL) 25 MG 24 hr tablet Take 1 tablet (25 mg total) by mouth daily. 10/19/12   Nelva Nay, MD  omeprazole (PRILOSEC) 10 MG capsule Take 10 mg by mouth daily.    Historical Provider, MD    Physical Exam: Filed Vitals:    12/14/14 0030 12/14/14 0105 12/14/14 0130 12/14/14 0306  BP: 107/55  112/50 107/54  Pulse: 94 88 89   Temp: 102.6 F (39.2 C) 101.5 F (38.6 C)  98.8 F (37.1 C)  TempSrc: Oral Oral  Oral  Resp: Height:    5' 4.8" (1.646 m)  Weight:    68.856 kg (151 lb 12.8 oz)  SpO2: 97% 97% 95% 97%   General: Not in acute distress HEENT:       Eyes: PERRL, EOMI, no scleral icterus       ENT: No discharge from the ears and nose, no pharynx injection, no tonsillar enlargement.        Neck: No JVD, no bruit, no mass felt. Heme: No neck or axillary lymph node enlargement Cardiac: S1/S2, RRR, tachycardia, No murmurs, No gallops or rubs Pulm: Good air movement bilaterally. Clear to auscultation bilaterally. No rales, wheezing, rhonchi or rubs. Abd: Soft, nondistended, tenderness over RLQ, no rebound pain, no organomegaly, BS present Ext: No edema bilaterally. 2+DP/PT pulse bilaterally Musculoskeletal: No joint deformities, erythema, or stiffness, ROM full Skin: No rashes.  Neuro: Alert, oriented X3, cranial nerves II-XII grossly intact, muscle strength 5/5 in all extremities, sensation to light touch intact.  Psych: Patient is not psychotic, no suicidal or hemocidal ideation.  Labs on Admission:  Basic Metabolic Panel:  Recent Labs Lab 12/13/14 1950  NA 136  K 3.4*  CL 98*  CO2 25  GLUCOSE 169*  BUN 23*  CREATININE 1.05  CALCIUM 9.5   Liver Function Tests:  Recent Labs Lab 12/13/14 1950  AST 31  ALT 32  ALKPHOS 51  BILITOT 1.3*  PROT 7.6  ALBUMIN 4.2    Recent Labs Lab 12/13/14 1950  LIPASE 29   No results for input(s): AMMONIA in the last 168 hours. CBC:  Recent Labs Lab 12/13/14 1950  WBC 11.8*  NEUTROABS 10.5*  HGB 15.1  HCT 43.5  MCV 86.5  PLT 282   Cardiac Enzymes:  Recent Labs Lab 12/13/14 1950  TROPONINI 0.03    BNP (last 3 results) No results for input(s): BNP in the last 8760 hours.  ProBNP (last 3 results) No results for  input(s): PROBNP in the last 8760 hours.  CBG:  Recent Labs Lab 12/14/14 0318 12/14/14 0512  GLUCAP 125* 117*    Radiological Exams on Admission: Ct Abdomen Pelvis W Contrast  12/13/2014   CLINICAL DATA:  Diffuse abdominal pain. Nausea and vomiting for 6 hours. Cholecystectomy.  EXAM: CT ABDOMEN AND PELVIS WITH CONTRAST  TECHNIQUE: Multidetector CT imaging of the abdomen and pelvis was performed using the standard protocol following bolus administration of intravenous contrast.  CONTRAST:  25mL OMNIPAQUE IOHEXOL 300 MG/ML SOLN, OMNIPAQUE IOHEXOL 300 MG/ML SOLN  COMPARISON:  06/18/2014.  FINDINGS: Musculoskeletal: Chronic L1 compression fracture. No new compression fractures compared to prior exam. Lower lumbar spondylosis. Likely chronic T8 compression fracture.  Lung Bases: Pleural nodularity is present in the RIGHT lower lobe. 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 No comparison chest CT  is available and these regions were not seen on previous abdominal CT. Coronary artery atherosclerosis is present. If office based assessment of coronary risk factors has not been performed, it is now recommended.  Liver:  Normal.  Spleen:  Normal.  Gallbladder:  Cholecystectomy.  Common bile duct:  Normal.  Pancreas:  Normal.  Adrenal glands:  Unchanged LEFT adrenal myelolipoma.  Kidneys: Renal enhancement and excretion is within normal limits. LEFT ureter normal. RIGHT ureter normal.  Stomach:  Distended with contrast.  No thickening or obstruction.  Small bowel:  Within normal limits.  No dilation.  Colon: Fluid levels are present throughout the colon. These are abnormal but nonspecific. No colonic obstruction. No definite mural thickening.  Pelvic Genitourinary: Penile prosthesis reservoir in the abdomen. Urinary bladder appears normal.  Peritoneum: No free air.  No free fluid.  Vascular/lymphatic: Atherosclerosis.  Body Wall: Tiny fat containing periumbilical  hernia.  IMPRESSION: 1. Areas of RIGHT pleural thickening, suggestive of pleural plaques but incompletely visible. 2. Atherosclerosis and coronary artery disease. 3. Nonspecific fluid levels throughout the colon. These are most commonly associated with enteric infection. 4. Unchanged LEFT adrenal myelolipoma.   Electronically Signed   By: Andreas NewportGeoffrey  Lamke M.D.   On: 12/13/2014 21:27    EKG: Independently reviewed.  Abnormal findings:   Right bundle blockage which existed in previous EKG on 10/19/12. EKG also showed a pattern which is similar to flutter, but looks like artificial effect to me. I will repeat EKG.  Assessment/Plan Principal Problem:   Abdominal pain Active Problems:   Hypertension   Hypercholesteremia   Diabetes mellitus without complication   Acid reflux   Sepsis   GERD (gastroesophageal reflux disease)   Hypokalemia   Right lower quadrant abdominal pain  Abdominal pain, nausea, vomiting, diarrhea: Most likely due to enteritis as suggested by CT abdomen/pelvis. No recent antibiotics use, still need to rule out C. difficile. Currently the patient is septic with leukocytosis, fever and tachycardia. Hemodynamically stable.  -will admit to tele bed -IV Flagyl and Primaxin were started in the emergency room, will continue --will get Procalcitonin and trend lactic acid level per sepsis protocol -IVF: 3.2L of NS bolus in ED, followed by 125 cc/h -Zofran for nausea, and morphine for abdominal pain -Check C. difficile PCR and GI pathogen panel -Hold PPI until C. Difficile pcr result comes back  Sepsis secondary to possible enteritis: -See above  GERD: -hold PPI as above -start pepcid  Diabetes mellitus: No A1c on record. Patient is on metformin at home. -Sliding-scale insulin -Check A1c  Hyperlipidemia: No LDL on record. -Continue Zetia -Switch lovastatin to pravastatin hospital -FLP  Hypertension: -Hold HCTZ since patient needs IV fluid -continue  metoprolol  Hypokalemia: -Repleted  Abnormal EKG: Looks like artificial rather than flutter, though I'm not completely sure. Patient does not have palpitations or chest pain. -Repeat EKG   DVT ppx: SQ Heparin   Code Status: DNR Family Communication: None at bed side.  Disposition Plan: Admit to inpatient   Date of Service 12/14/2014    Lorretta HarpIU, Eveny Anastas [facility] Pager [phone]  If 7PM-7AM, please contact night-coverage [email or web address] Password [email or web address] 12/14/2014, 5:23 AM

## 2014-12-14 NOTE — Progress Notes (Signed)
Placed pt on cpap for the night at 4, pt stated home setting is 2.5 however hospital cpap lowest setting is 4. Pt is tolerating well at this time.

## 2014-12-14 NOTE — Progress Notes (Signed)
OT Cancellation Note  Patient Details Name: Jeremy Stuart MRN: [id number] DOB: 19-Jul-1930   Cancelled Treatment:    Reason Eval/Treat Not Completed: OT screened, no needs identified, will sign off. Spoke with pt and wife and pt has no OT concerns.   Earlie RavelingStraub, Kalayna Noy L OTR/L [phone] 12/14/2014, 1:58 PM

## 2014-12-14 NOTE — Evaluation (Signed)
Physical Therapy Evaluation and Discharge Patient Details Name: Jeremy Stuart MRN: [id number] DOB: 11-Mar-1930 Today's Date: 12/14/2014   History of Present Illness  Pt is an 81 y/o male admitted with abdominal pain, N/V/D. Possible enteritis. PMH includes HTN, hyperlipidemia, DM, GERD, prostatitis.  Clinical Impression  Patient evaluated by Physical Therapy with no further acute PT needs identified. All education has been completed and the patient has no further questions. At the time of PT eval pt was able to perform all mobility at a mod I to independent level with no use of AD. Pt states he is at baseline of function. See below for any follow-up Physial Therapy or equipment needs. PT is signing off. Thank you for this referral.     Follow Up Recommendations No PT follow up    Equipment Recommendations  None recommended by PT    Recommendations for Other Services       Precautions / Restrictions Precautions Precautions: Fall Restrictions Weight Bearing Restrictions: No      Mobility  Bed Mobility Overal bed mobility: Independent                Transfers Overall transfer level: Modified independent Equipment used: None                Ambulation/Gait Ambulation/Gait assistance: Modified independent (Device/Increase time) Ambulation Distance (Feet): 375 Feet Assistive device: None Gait Pattern/deviations: WFL(Within Functional Limits) Gait velocity: Slightly decreased   General Gait Details: Pt ambulated well with no reports of dizziness or lightheadedness. Demonstrated head turns x2 directions with no unsteadiness noted.  Stairs            Wheelchair Mobility    Modified Rankin (Stroke Patients Only)       Balance Overall balance assessment: No apparent balance deficits (not formally assessed)                                           Pertinent Vitals/Pain Pain Assessment: No/denies pain    Home Living Family/patient  expects to be discharged to:: Private residence Living Arrangements: Spouse/significant other Available Help at Discharge: Family Type of Home: House Home Access: Level entry     Home Layout: Two level;Able to live on main level with bedroom/bathroom Home Equipment: Cane - single point;Grab bars - tub/shower      Prior Function Level of Independence: Independent         Comments: Still driving, community ambulator.      Hand Dominance   Dominant Hand: Right    Extremity/Trunk Assessment   Upper Extremity Assessment: Overall WFL for tasks assessed           Lower Extremity Assessment: Overall WFL for tasks assessed      Cervical / Trunk Assessment: Normal (Forward head posture)  Communication   Communication: HOH  Cognition Arousal/Alertness: Awake/alert Behavior During Therapy: WFL for tasks assessed/performed Overall Cognitive Status: Within Functional Limits for tasks assessed                      General Comments General comments (skin integrity, edema, etc.): Pt reports he has vertigo and has been routinely doing exercises to assist with this for the past 5 years. It appears that he has been receiving treatment for some time and that his symptoms are fairly well controlled. No onset of vertigo during session even with head turns during ambulation.  Exercises        Assessment/Plan    PT Assessment Patent does not need any further PT services  PT Diagnosis Difficulty walking   PT Problem List    PT Treatment Interventions     PT Goals (Current goals can be found in the Care Plan section) Acute Rehab PT Goals PT Goal Formulation: All assessment and education complete, DC therapy    Frequency     Barriers to discharge        Co-evaluation               End of Session Equipment Utilized During Treatment: Gait belt Activity Tolerance: Patient tolerated treatment well Patient left: in chair;with call bell/phone within  reach Nurse Communication: Mobility status         Time: 0827-0850 PT Time Calculation (min) (ACUTE ONLY): 23 min   Charges:   PT Evaluation $Initial PT Evaluation Tier I: 1 Procedure PT Treatments $Gait Training: 8-22 mins   PT G Codes:        Conni SlipperKirkman, Janace Decker 12/14/2014, 9:07 AM   Conni SlipperLaura Draxton Luu, PT, DPT Acute Rehabilitation Services Pager: [phone]

## 2014-12-15 ENCOUNTER — Inpatient Hospital Stay (HOSPITAL_COMMUNITY): Payer: Medicare Other

## 2014-12-15 DIAGNOSIS — R112 Nausea with vomiting, unspecified: Secondary | ICD-10-CM

## 2014-12-15 DIAGNOSIS — I10 Essential (primary) hypertension: Secondary | ICD-10-CM

## 2014-12-15 DIAGNOSIS — R109 Unspecified abdominal pain: Secondary | ICD-10-CM

## 2014-12-15 DIAGNOSIS — E119 Type 2 diabetes mellitus without complications: Secondary | ICD-10-CM

## 2014-12-15 DIAGNOSIS — E780 Pure hypercholesterolemia: Secondary | ICD-10-CM

## 2014-12-15 DIAGNOSIS — E876 Hypokalemia: Secondary | ICD-10-CM

## 2014-12-15 DIAGNOSIS — A419 Sepsis, unspecified organism: Principal | ICD-10-CM

## 2014-12-15 LAB — LIPID PANEL
CHOL/HDL RATIO: 3 ratio
CHOLESTEROL: 100 mg/dL (ref 0–200)
HDL: 33 mg/dL — AB (ref >40)
LDL Cholesterol: 52 mg/dL (ref 0–99)
Triglycerides: 73 mg/dL (ref <150)
VLDL: 15 mg/dL (ref 0–40)

## 2014-12-15 LAB — URINALYSIS, ROUTINE W REFLEX MICROSCOPIC
GLUCOSE, UA: NEGATIVE mg/dL
HGB URINE DIPSTICK: NEGATIVE
KETONES UR: 15 mg/dL — AB
Nitrite: NEGATIVE
PROTEIN: 30 mg/dL — AB
Specific Gravity, Urine: 1.023 (ref 1.005–1.030)
Urobilinogen, UA: 1 mg/dL (ref 0.0–1.0)
pH: 5.5 (ref 5.0–8.0)

## 2014-12-15 LAB — BASIC METABOLIC PANEL
Anion gap: 9 (ref 5–15)
BUN: 11 mg/dL (ref 6–20)
CHLORIDE: 104 mmol/L (ref 101–111)
CO2: 23 mmol/L (ref 22–32)
Calcium: 7.6 mg/dL — ABNORMAL LOW (ref 8.9–10.3)
Creatinine, Ser: 0.88 mg/dL (ref 0.61–1.24)
GFR calc Af Amer: >60 mL/min (ref >60)
GFR calc non Af Amer: >60 mL/min (ref >60)
Glucose, Bld: 165 mg/dL — ABNORMAL HIGH (ref 65–99)
Potassium: 3 mmol/L — ABNORMAL LOW (ref 3.5–5.1)
SODIUM: 136 mmol/L (ref 135–145)

## 2014-12-15 LAB — CBC
HCT: 36.2 % — ABNORMAL LOW (ref 39.0–52.0)
HEMOGLOBIN: 12.2 g/dL — AB (ref 13.0–17.0)
MCH: 29.3 pg (ref 26.0–34.0)
MCHC: 33.7 g/dL (ref 30.0–36.0)
MCV: 86.8 fL (ref 78.0–100.0)
Platelets: 183 10*3/uL (ref 150–400)
RBC: 4.17 MIL/uL — AB (ref 4.22–5.81)
RDW: 14.3 % (ref 11.5–15.5)
WBC: 5.4 10*3/uL (ref 4.0–10.5)

## 2014-12-15 LAB — GLUCOSE, CAPILLARY: Glucose-Capillary: 120 mg/dL — ABNORMAL HIGH (ref 65–99)

## 2014-12-15 LAB — MAGNESIUM: MAGNESIUM: 1.2 mg/dL — AB (ref 1.7–2.4)

## 2014-12-15 LAB — HEMOGLOBIN A1C
HEMOGLOBIN A1C: 7.3 % — AB (ref 4.8–5.6)
MEAN PLASMA GLUCOSE: 163 mg/dL

## 2014-12-15 LAB — URINE MICROSCOPIC-ADD ON

## 2014-12-15 MED ORDER — MAGNESIUM SULFATE 4 GM/100ML IV SOLN
4.0000 g | Freq: Once | INTRAVENOUS | Status: AC
  Administered 2014-12-15: 4 g via INTRAVENOUS
  Filled 2014-12-15: qty 100

## 2014-12-15 MED ORDER — POTASSIUM CHLORIDE CRYS ER 20 MEQ PO TBCR
40.0000 meq | EXTENDED_RELEASE_TABLET | ORAL | Status: AC
  Administered 2014-12-15 (×2): 40 meq via ORAL
  Filled 2014-12-15 (×2): qty 2

## 2014-12-15 NOTE — Progress Notes (Signed)
[facility] PROGRESS NOTE  Jeremy Stuart XBJ:[id number] DOB: March 27, 1930 DOA: 12/13/2014 PCP: No primary care provider on file.  Assessment/Plan: #1 abdominal pain/nausea/vomiting/diarrhea Likely secondary to gastroenteritis. Patient was some clinical improvement with no nausea or emesis of further diarrhea. CT abdomen and pelvis consistent with air-fluid levels consistent with an enteritis. C. difficile PCR is negative. On admission patient felt to be septic as he did have a leukocytosis fever and tachycardia. Leukocytosis has results from labs from yesterday. Labs are pending. Blood cultures were no growth to date. Repeat UA with cultures and sensitivities. Patient complaining of right-sided rib pain. Will get rib films to rule out rib fracture. Patient is currently afebrile. Will DC IV Flagyl. Continue Primaxin for now. Supportive care. Tolerating current diet. Follow.  #2 probable sepsis Felt to be secondary to an enteritis. Improved.  #3 gastroesophageal reflux disease Continue Pepcid.  #4 diabetes mellitus Hemoglobin A1c pending. Continue sliding scale insulin.  #5 hyperlipidemia Fasting lipid panel with LDL of 52. Continue Zetia and Pravachol.  #6 hypertension HCTZ on hold. BP stable. Continue beta blocker.  #7 hypokalemia Likely secondary to diuretics. Labs pending.  #8 abnormal EKG Patient asymptomatic. Repeat EKG with right bundle branch block which is old.  #9 prophylaxis Pepcid for GI prophylaxis. Heparin for DVT prophylaxis.  Code Status: DO NOT RESUSCITATE Family Communication: Updated patient no family present. Disposition Plan: Hopefully home in the next 24 hours if continued improvement.   Consultants:  None  Procedures:  CT abdomen and pelvis 12/13/2014  Antibiotics:  IV Flagyl 12/14/2014>>>> 12/15/2014  IV Primaxin 12/14/2014>>>> 12/15/2014  HPI/Subjective: Patient denies any nausea vomiting or diarrhea. Patient states is tolerating current.  Does not like the food. Patient complaining of right-sided pain around his rib area which she states hurt really badly last night and had to be given some morphine for the pain which has subsequently improved. Patient states this pain has been ongoing for the past month.  Objective: Filed Vitals:   12/15/14 0549  BP: 123/81  Pulse: 50  Temp: 98.3 F (36.8 C)  Resp: 18    Intake/Output Summary (Last 24 hours) at 12/15/14 1047 Last data filed at 12/15/14 1024  Gross per 24 hour  Intake 2385.83 ml  Output    550 ml  Net 1835.83 ml   Filed Weights   12/13/14 1842 12/14/14 0306  Weight: 75.751 kg (167 lb) 68.856 kg (151 lb 12.8 oz)    Exam:   General:  NAD  Cardiovascular: RRR  Respiratory: CTAB  Abdomen: Soft, nontender, nondistended, positive bowel sounds.  Musculoskeletal: No clubbing cyanosis or edema. Right rib with minimal tenderness to palpation.  Data Reviewed: Basic Metabolic Panel:  Recent Labs Lab 12/13/14 1950 12/14/14 0452  NA 136 137  K 3.4* 3.5  CL 98* 104  CO2 25 25  GLUCOSE 169* 136*  BUN 23* 15  CREATININE 1.05 1.03  CALCIUM 9.5 7.8*   Liver Function Tests:  Recent Labs Lab 12/13/14 1950 12/14/14 0452  AST 31 22  ALT 32 24  ALKPHOS 51 38  BILITOT 1.3* 1.1  PROT 7.6 5.1*  ALBUMIN 4.2 2.7*    Recent Labs Lab 12/13/14 1950  LIPASE 29   No results for input(s): AMMONIA in the last 168 hours. CBC:  Recent Labs Lab 12/13/14 1950 12/14/14 0452  WBC 11.8* 6.4  NEUTROABS 10.5*  --   HGB 15.1 12.3*  HCT 43.5 36.5*  MCV 86.5 86.7  PLT 282 230   Cardiac Enzymes:  Recent Labs  Lab 12/13/14 1950  TROPONINI 0.03   BNP (last 3 results) No results for input(s): BNP in the last 8760 hours.  ProBNP (last 3 results) No results for input(s): PROBNP in the last 8760 hours.  CBG:  Recent Labs Lab 12/14/14 0318 12/14/14 0512 12/14/14 0809 12/15/14 0734  GLUCAP 125* 117* 135* 120*    Recent Results (from the past 240  hour(s))  Culture, blood (x 2)     Status: None (Preliminary result)   Collection Time: 12/14/14  4:50 AM  Result Value Ref Range Status   Specimen Description BLOOD LEFT ANTECUBITAL  Final   Special Requests BOTTLES DRAWN AEROBIC AND ANAEROBIC  5CC EACH  Final   Culture   Final           BLOOD CULTURE RECEIVED NO GROWTH TO DATE CULTURE WILL BE HELD FOR 5 DAYS BEFORE ISSUING A FINAL NEGATIVE REPORT [facility]    Report Status PENDING  Incomplete  Culture, blood (x 2)     Status: None (Preliminary result)   Collection Time: 12/14/14  4:57 AM  Result Value Ref Range Status   Specimen Description BLOOD RIGHT HAND  Final   Special Requests BOTTLES DRAWN AEROBIC AND ANAEROBIC  5CC EACH  Final   Culture   Final           BLOOD CULTURE RECEIVED NO GROWTH TO DATE CULTURE WILL BE HELD FOR 5 DAYS BEFORE ISSUING A FINAL NEGATIVE REPORT [facility]formed at [facility]    Report Status PENDING  Incomplete  Clostridium Difficile by PCR     Status: None   Collection Time: 12/14/14  6:29 AM  Result Value Ref Range Status   C difficile by pcr NEGATIVE NEGATIVE Final     Studies: Ct Abdomen Pelvis W Contrast  12/13/2014   CLINICAL DATA:  Diffuse abdominal pain. Nausea and vomiting for 6 hours. Cholecystectomy.  EXAM: CT ABDOMEN AND PELVIS WITH CONTRAST  TECHNIQUE: Multidetector CT imaging of the abdomen and pelvis was performed using the standard protocol following bolus administration of intravenous contrast.  CONTRAST:  25mL OMNIPAQUE IOHEXOL 300 MG/ML SOLN, 100mL OMNIPAQUE IOHEXOL 300 MG/ML SOLN  COMPARISON:  06/18/2014.  FINDINGS: Musculoskeletal: Chronic L1 compression fracture. No new compression fractures compared to prior exam. Lower lumbar spondylosis. Likely chronic T8 compression fracture.  Lung Bases: Pleural nodularity is present in the RIGHT lower lobe. 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 No comparison chest CT is  available and these regions were not seen on previous abdominal CT. Coronary artery atherosclerosis is present. If office based assessment of coronary risk factors has not been performed, it is now recommended.  Liver:  Normal.  Spleen:  Normal.  Gallbladder:  Cholecystectomy.  Common bile duct:  Normal.  Pancreas:  Normal.  Adrenal glands:  Unchanged LEFT adrenal myelolipoma.  Kidneys: Renal enhancement and excretion is within normal limits. LEFT ureter normal. RIGHT ureter normal.  Stomach:  Distended with contrast.  No thickening or obstruction.  Small bowel:  Within normal limits.  No dilation.  Colon: Fluid levels are present throughout the colon. These are abnormal but nonspecific. No colonic obstruction. No definite mural thickening.  Pelvic Genitourinary: Penile prosthesis reservoir in the abdomen. Urinary bladder appears normal.  Peritoneum: No free air.  No free fluid.  Vascular/lymphatic: Atherosclerosis.  Body Wall: Tiny fat containing periumbilical hernia.  IMPRESSION: 1. Areas of RIGHT pleural thickening, suggestive of pleural plaques but incompletely visible. 2. Atherosclerosis and coronary  artery disease. 3. Nonspecific fluid levels throughout the colon. These are most commonly associated with enteric infection. 4. Unchanged LEFT adrenal myelolipoma.   Electronically Signed   By: Andreas NewportGeoffrey  Lamke M.D.   On: 12/13/2014 21:27    Scheduled Meds: . calcium carbonate  1,250 mg Oral BID WC  . ezetimibe  10 mg Oral Daily  . famotidine  20 mg Oral Daily  . heparin  5,000 Units Subcutaneous 3 times per day  . imipenem-cilastatin  250 mg Intravenous Q6H  . metoprolol succinate  25 mg Oral Daily  . metronidazole  500 mg Intravenous Q8H  . pravastatin  20 mg Oral q1800   Continuous Infusions: . sodium chloride 75 mL/hr at 12/15/14 0543    Principal Problem:   Abdominal pain Active Problems:   Hypertension   Hypercholesteremia   Diabetes mellitus without complication   Acid reflux    Sepsis   GERD (gastroesophageal reflux disease)   Hypokalemia   Right lower quadrant abdominal pain    Time spent: 40 mins    [facility] Rehabilitation HospitalHOMPSON,DANIEL MD [facility] Pager [phone]. If 7PM-7AM, please contact night-coverage at [email or web address], password [email or web address] 12/15/2014, 10:47 AM  LOS: 1 day

## 2014-12-15 NOTE — Progress Notes (Signed)
Patient complaining pain right lower abdomen and right flank pain rates 10/10.Patient restless in bed stating ,"I haven't had this kind of pain since before coming to hospital." Text paged Tama GanderKatherine Schorr NP. New order received for morphine 4  mg IV for 1 dose . Will continue to monitor patient.

## 2014-12-16 DIAGNOSIS — K219 Gastro-esophageal reflux disease without esophagitis: Secondary | ICD-10-CM

## 2014-12-16 LAB — GI PATHOGEN PANEL BY PCR, STOOL
C difficile toxin A/B: NOT DETECTED
Campylobacter by PCR: NOT DETECTED
Cryptosporidium by PCR: NOT DETECTED
E COLI (ETEC) LT/ST: NOT DETECTED
E coli (STEC): NOT DETECTED
E coli 0157 by PCR: NOT DETECTED
G LAMBLIA BY PCR: NOT DETECTED
Rotavirus A by PCR: NOT DETECTED
SALMONELLA BY PCR: NOT DETECTED
SHIGELLA BY PCR: NOT DETECTED

## 2014-12-16 LAB — CBC
HCT: 33.2 % — ABNORMAL LOW (ref 39.0–52.0)
Hemoglobin: 11.2 g/dL — ABNORMAL LOW (ref 13.0–17.0)
MCH: 29.2 pg (ref 26.0–34.0)
MCHC: 33.7 g/dL (ref 30.0–36.0)
MCV: 86.5 fL (ref 78.0–100.0)
Platelets: 171 10*3/uL (ref 150–400)
RBC: 3.84 MIL/uL — AB (ref 4.22–5.81)
RDW: 14.2 % (ref 11.5–15.5)
WBC: 4.9 10*3/uL (ref 4.0–10.5)

## 2014-12-16 LAB — BASIC METABOLIC PANEL
ANION GAP: 8 (ref 5–15)
BUN: 8 mg/dL (ref 6–20)
CHLORIDE: 108 mmol/L (ref 101–111)
CO2: 22 mmol/L (ref 22–32)
Calcium: 7.7 mg/dL — ABNORMAL LOW (ref 8.9–10.3)
Creatinine, Ser: 0.86 mg/dL (ref 0.61–1.24)
GFR calc non Af Amer: >60 mL/min (ref >60)
Glucose, Bld: 119 mg/dL — ABNORMAL HIGH (ref 65–99)
POTASSIUM: 3.6 mmol/L (ref 3.5–5.1)
Sodium: 138 mmol/L (ref 135–145)

## 2014-12-16 LAB — URINE CULTURE
CULTURE: NO GROWTH
Colony Count: NO GROWTH

## 2014-12-16 LAB — GLUCOSE, CAPILLARY: Glucose-Capillary: 118 mg/dL — ABNORMAL HIGH (ref 65–99)

## 2014-12-16 NOTE — Progress Notes (Signed)
Utilization Review completed. Kairo Laubacher RN BSN CM 

## 2014-12-16 NOTE — Discharge Summary (Signed)
Physician Discharge Summary  Geoffery Lyonshomas Dula ZOX:[id number] DOB: 20-Jan-1930 DOA: 12/13/2014  PCP: Albertina SenegalPOLLOCK, NELSON, MD  Admit date: 12/13/2014 Discharge date: 12/16/2014  Time spent: 65 minutes  Recommendations for Outpatient Follow-up:  1. Follow-up with POLLOCK, NELSON, MD in 1-2 weeks. On follow-up patient needs a basic metabolic profile done to follow-up on electrolytes and renal function. Patient needed a magnesium level checked. Patient also needs CBC checked. Patient's blood pressure medications were adjusted and his HCTZ discontinued. Patient's blood pressure needs to be reassessed and managed further per PCP.  Discharge Diagnoses:  Principal Problem:   Abdominal pain Active Problems:   Hypertension   Hypercholesteremia   Diabetes mellitus without complication   Acid reflux   Sepsis   GERD (gastroesophageal reflux disease)   Hypokalemia   Right lower quadrant abdominal pain   Nausea with vomiting   Discharge Condition: Stable and improved  Diet recommendation: Regular  Filed Weights   12/13/14 1842 12/14/14 0306  Weight: 75.751 kg (167 lb) 68.856 kg (151 lb 12.8 oz)    History of present illness:  Per Dr Shawn RouteNiu Pierre Andi HenceReagan is a 81 y.o. male with PMH of hypertension, hyperlipidemia, diabetes mellitus, GERD, history of prostatitis, who presented with abdominal pain, nausea, vomiting and diarrhea.  Patient reported that he started having abdominal pain at 12:30 p.m. It is located in the right lower quadrant, constant, moderate pain, nonradiating. It is associated with fever and chills. He had vomited 3 times. He had 2 bowel movements with loose stool. He denies recent antibiotics use. No symptoms of UTI. He took a Tylenol and then 1/2 of a Vicodin with only slight relief.  Currently patient denies running nose, ear pain, headaches, cough, chest pain, SOB, dysuria, urgency, frequency, hematuria, skin rashes, joint pain or leg swelling. No unilateral weakness, numbness or  tingling sensations. No vision change or hearing loss.  In ED, patient was found to have WBC 11.8, fever with temperature 101.5, tachycardia. Potassium 3.4, renal function okay, negative lipase, negative urinalysis, initial lactate is 3.11. CT-abd/pelvis showed nonspecific fluid levels throughout the colo, which is most commonly associated with enteric infection and unchanged LEFT adrenal myelolipoma. Patient is admitted to inpatient for further evaluation and treatment.  Hospital Course:  #1 abdominal pain/nausea/vomiting/diarrhea likely secondary to gastroenteritis. Patient had presented with abdominal pain nausea vomiting and diarrhea. Likely secondary to gastroenteritis. Patient was placed empirically on IV Primaxin and IV Flagyl as he was noted to have a fever on admission with a leukocytosis. CT abdomen and pelvis consistent with air-fluid levels consistent with an enteritis. C. difficile PCR was negative. On admission patient felt to be septic as he did have a leukocytosis fever and tachycardia. Leukocytosis improved and resolved. Patient was pancultured with no growth to date. Repeat urinalysis was done which was negative. IV antibiotics were discontinued. Patient remained afebrile. Patient's white count had normalized. Patient diet was advanced and he was tolerating a solid diet by day of discharge. Patient was also some complaints of right rib pain. Plain films of the right ribs were obtained which were negative for any acute fracture. Patient improved clinically and will be discharged home in stable and improved condition.  #2 probable sepsis Felt to be secondary to an enteritis. Improved.  #3 gastroesophageal reflux disease Continued on Pepcid.  #4 diabetes mellitus Hemoglobin A1c 7.3. CBGs have ranged from 119-165. Continue sliding scale insulin.  #5 hyperlipidemia Fasting lipid panel with LDL of 52. Continued on home regimen of Zetia and Pravachol.  #6 hypertension HCTZ  on hold. BP  stable. Continued on beta blocker.  #7 hypokalemia/hypomagnesemia Likely secondary to diuretics and GI losses. Repleted.   #8 abnormal EKG Patient asymptomatic. Repeat EKG with right bundle branch block which is old.  Procedures:  CT abdomen and pelvis 12/13/2014  Consultations:  None  Discharge Exam: Filed Vitals:   12/16/14 0600  BP: 134/54  Pulse: 52  Temp: 98.2 F (36.8 C)  Resp: 20    General: NAD Cardiovascular: RRR Respiratory: CTAB  Discharge Instructions   Discharge Instructions    Diet general    Complete by:  As directed      Discharge instructions    Complete by:  As directed   Follow up with PCP in 1 week     Increase activity slowly    Complete by:  As directed           Current Discharge Medication List    CONTINUE these medications which have NOT CHANGED   Details  calcium carbonate (OS-CAL) 600 MG TABS Take 600 mg by mouth 2 (two) times daily with a meal.    clonazePAM (KLONOPIN) 2 MG tablet Take 1 mg by mouth at bedtime.     ezetimibe (ZETIA) 10 MG tablet Take 10 mg by mouth daily.    lovastatin (MEVACOR) 40 MG tablet Take 40 mg by mouth at bedtime.    metFORMIN (GLUCOPHAGE) 500 MG tablet Take 500 mg by mouth 2 (two) times daily with a meal.    omeprazole (PRILOSEC) 20 MG capsule Take 20 mg by mouth daily.      STOP taking these medications     hydrochlorothiazide (HYDRODIURIL) 25 MG tablet      metoprolol succinate (TOPROL-XL) 25 MG 24 hr tablet        Allergies  Allergen Reactions  . Ciprofloxacin Anaphylaxis  . Penicillins Anaphylaxis  . Sulfa Antibiotics Anaphylaxis  . Atorvastatin   . Glimepiride     Constipation   . Silodosin     Dizziness    Follow-up Information    Follow up with Albertina Senegal, MD. Schedule an appointment as soon as possible for a visit on 12/23/2014.   Specialty:  Internal Medicine   Why:  f/u in 1-2 weeks Appopintment with Dr. Julius Bowels PA is on 12/23/14 at 10:15   Contact information:    [address] [place] [address] [phone]        The results of significant diagnostics from this hospitalization (including imaging, microbiology, ancillary and laboratory) are listed below for reference.    Significant Diagnostic Studies: Dg Ribs Unilateral W/chest Right  12/15/2014   CLINICAL DATA:  right lower anterior rib pain. No known injury, no bruising, no SOB. He states that the area feels tender.Hx of HTN, DM  EXAM: RIGHT RIBS AND CHEST - 3+ VIEW  COMPARISON:  CT 12/13/2014 and earlier studies  FINDINGS: Cortical contour deformity of the anterolateral margin right tenth rib, corresponding to the external BB marker. No displacement. Old fracture deformity of the lateral aspect right sixth rib, present on prior study of 10/19/2012. Atheromatous aorta. There is no evidence of pneumothorax or pleural effusion. Both lungs are clear. Mild cardiomegaly.  IMPRESSION: 1. Right sixth and tenth rib deformities without displacement, pneumothorax, or other acute finding.   Electronically Signed   By: Corlis Leak M.D.   On: 12/15/2014 13:30   Ct Abdomen Pelvis W Contrast  12/13/2014   CLINICAL DATA:  Diffuse abdominal pain. Nausea and vomiting for 6 hours. Cholecystectomy.  EXAM: CT ABDOMEN AND PELVIS WITH CONTRAST  TECHNIQUE: Multidetector CT imaging of the abdomen and pelvis was performed using the standard protocol following bolus administration of intravenous contrast.  CONTRAST:  25mL OMNIPAQUE IOHEXOL 300 MG/ML SOLN, OMNIPAQUE IOHEXOL 300 MG/ML SOLN  COMPARISON:  06/18/2014.  FINDINGS: Musculoskeletal: Chronic L1 compression fracture. No new compression fractures compared to prior exam. Lower lumbar spondylosis. Likely chronic T8 compression fracture.  Lung Bases: Pleural nodularity is present in the RIGHT lower lobe. 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 No comparison chest CT is available and these regions were not seen on previous  abdominal CT. Coronary artery atherosclerosis is present. If office based assessment of coronary risk factors has not been performed, it is now recommended.  Liver:  Normal.  Spleen:  Normal.  Gallbladder:  Cholecystectomy.  Common bile duct:  Normal.  Pancreas:  Normal.  Adrenal glands:  Unchanged LEFT adrenal myelolipoma.  Kidneys: Renal enhancement and excretion is within normal limits. LEFT ureter normal. RIGHT ureter normal.  Stomach:  Distended with contrast.  No thickening or obstruction.  Small bowel:  Within normal limits.  No dilation.  Colon: Fluid levels are present throughout the colon. These are abnormal but nonspecific. No colonic obstruction. No definite mural thickening.  Pelvic Genitourinary: Penile prosthesis reservoir in the abdomen. Urinary bladder appears normal.  Peritoneum: No free air.  No free fluid.  Vascular/lymphatic: Atherosclerosis.  Body Wall: Tiny fat containing periumbilical hernia.  IMPRESSION: 1. Areas of RIGHT pleural thickening, suggestive of pleural plaques but incompletely visible. 2. Atherosclerosis and coronary artery disease. 3. Nonspecific fluid levels throughout the colon. These are most commonly associated with enteric infection. 4. Unchanged LEFT adrenal myelolipoma.   Electronically Signed   By: Andreas Newport M.D.   On: 12/13/2014 21:27    Microbiology: Recent Results (from the past 240 hour(s))  Culture, blood (x 2)     Status: None (Preliminary result)   Collection Time: 12/14/14  4:50 AM  Result Value Ref Range Status   Specimen Description BLOOD LEFT ANTECUBITAL  Final   Special Requests BOTTLES DRAWN AEROBIC AND ANAEROBIC  5CC EACH  Final   Culture   Final           BLOOD CULTURE RECEIVED NO GROWTH TO DATE CULTURE WILL BE HELD FOR 5 DAYS BEFORE ISSUING A FINAL NEGATIVE REPORT Performed at [facility]    Report Status PENDING  Incomplete  Culture, blood (x 2)     Status: None (Preliminary result)   Collection Time: 12/14/14  4:57 AM   Result Value Ref Range Status   Specimen Description BLOOD RIGHT HAND  Final   Special Requests BOTTLES DRAWN AEROBIC AND ANAEROBIC  5CC EACH  Final   Culture   Final           BLOOD CULTURE RECEIVED NO GROWTH TO DATE CULTURE WILL BE HELD FOR 5 DAYS BEFORE ISSUING A FINAL NEGATIVE REPORT Performed at [facility]    Report Status PENDING  Incomplete  Clostridium Difficile by PCR     Status: None   Collection Time: 12/14/14  6:29 AM  Result Value Ref Range Status   C difficile by pcr NEGATIVE NEGATIVE Final     Labs: Basic Metabolic Panel:  Recent Labs Lab 12/13/14 1950 12/14/14 0452 12/15/14 1054 12/16/14 0610  NA 136 137 136 138  K 3.4* 3.5 3.0* 3.6  CL 98* 104 104 108  CO2 GLUCOSE  169* 136* 165* 119*  BUN 23* 15 11 8   CREATININE 1.05 1.03 0.88 0.86  CALCIUM 9.5 7.8* 7.6* 7.7*  MG  --   --  1.2*  --    Liver Function Tests:  Recent Labs Lab 12/13/14 1950 12/14/14 0452  AST 31 22  ALT 32 24  ALKPHOS 51 38  BILITOT 1.3* 1.1  PROT 7.6 5.1*  ALBUMIN 4.2 2.7*    Recent Labs Lab 12/13/14 1950  LIPASE 29   No results for input(s): AMMONIA in the last 168 hours. CBC:  Recent Labs Lab 12/13/14 1950 12/14/14 0452 12/15/14 1054 12/16/14 0610  WBC 11.8* 6.4 5.4 4.9  NEUTROABS 10.5*  --   --   --   HGB 15.1 12.3* 12.2* 11.2*  HCT 43.5 36.5* 36.2* 33.2*  MCV 86.5 86.7 86.8 86.5  PLT 282 230 183 171   Cardiac Enzymes:  Recent Labs Lab 12/13/14 1950  TROPONINI 0.03   BNP: BNP (last 3 results) No results for input(s): BNP in the last 8760 hours.  ProBNP (last 3 results) No results for input(s): PROBNP in the last 8760 hours.  CBG:  Recent Labs Lab 12/14/14 0318 12/14/14 0512 12/14/14 0809 12/15/14 0734 12/16/14 0814  GLUCAP 125* 117* 135* 120* 118*       Signed:  Koichi Platte M.D. [facility] 12/16/2014, 11:27 AM

## 2014-12-16 NOTE — Progress Notes (Signed)
Patient was discharged home by MD order; discharged instructions review and give to patient with care notes; IV DIC; skin intact; patient will be escorted to the car by a volunteer via wheelchair.  

## 2014-12-20 LAB — CULTURE, BLOOD (ROUTINE X 2)
CULTURE: NO GROWTH
CULTURE: NO GROWTH

## 2016-04-14 IMAGING — CT CT ABD-PELV W/ CM
2 of 5 series · 16 of 46 positions shown, 18 images · IV contrast (APPLIED)
Comparison: 06/18/2014.

CLINICAL DATA: Diffuse abdominal pain. Nausea and vomiting for 6
hours. Cholecystectomy.

EXAM:
CT ABDOMEN AND PELVIS WITH CONTRAST
TECHNIQUE: Multidetector CT imaging of the abdomen and pelvis was performed
using the standard protocol following bolus administration of
intravenous contrast.
CONTRAST:  25mL OMNIPAQUE IOHEXOL 300 MG/ML SOLN, 100mL OMNIPAQUE
IOHEXOL 300 MG/ML SOLN

[Series 2: abd/pelvis 5.0 b31f · axial · 0.70mm/px · z∈[-467,-52]mm · 13 of 95 slices shown, 15 images]
[im 6/95  soft-tissue]
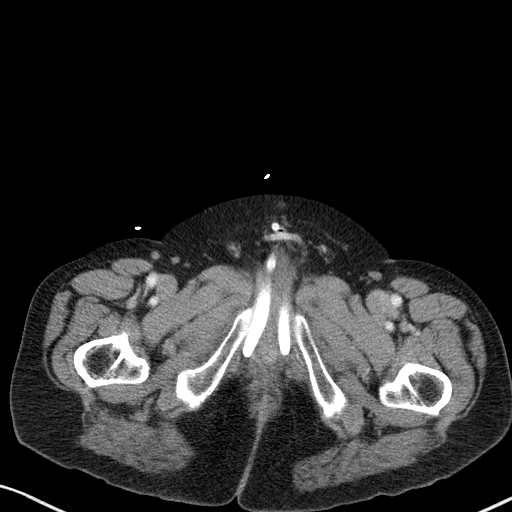
[im 6/95  bone]
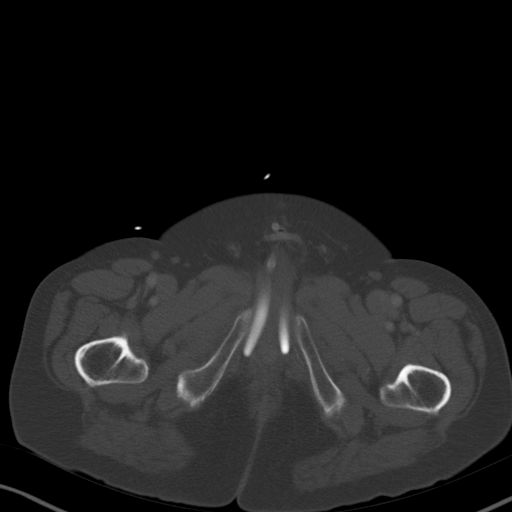
[im 11/95  soft-tissue]
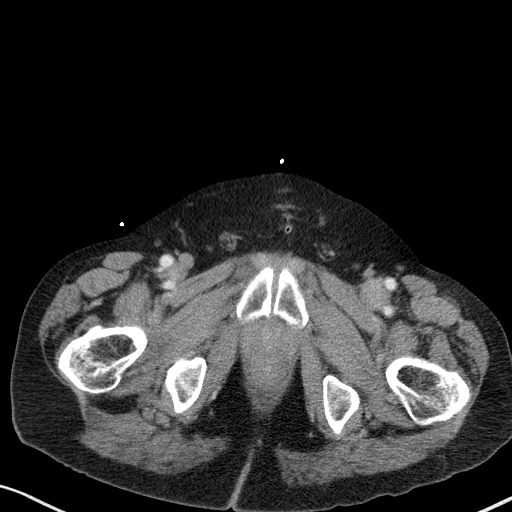
[im 21/95  soft-tissue]
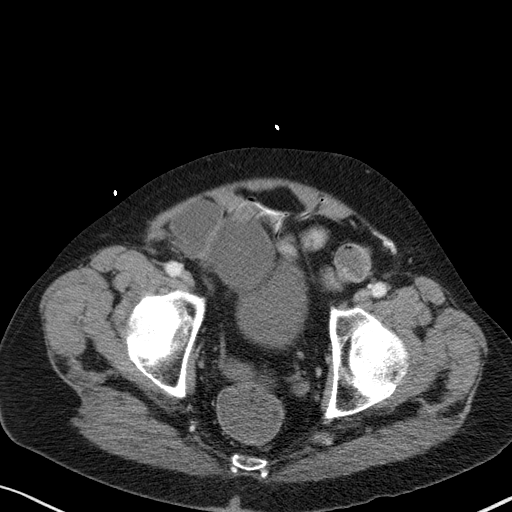
[im 27/95  soft-tissue]
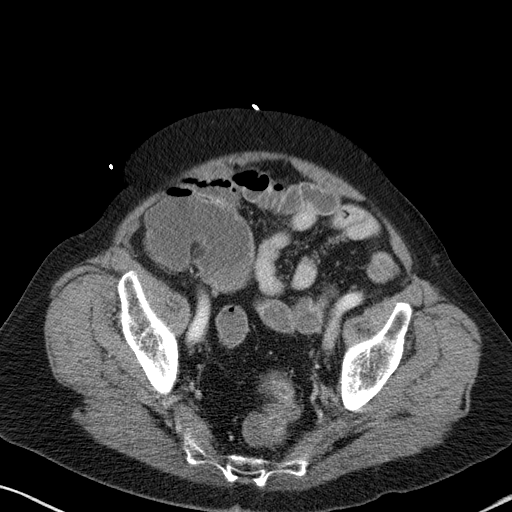
[im 32/95  soft-tissue]
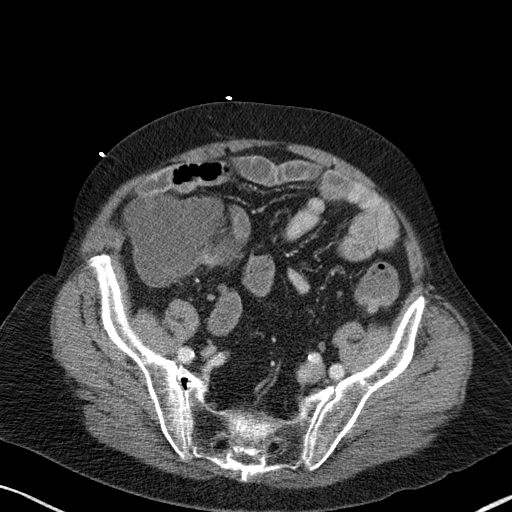
[im 42/95  soft-tissue]
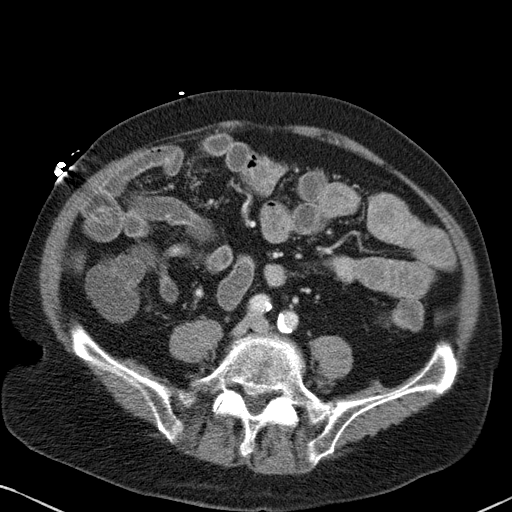
[im 48/95  soft-tissue]
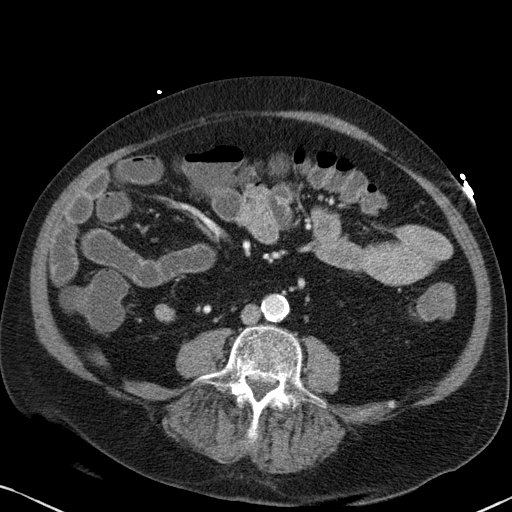
[im 53/95  soft-tissue]
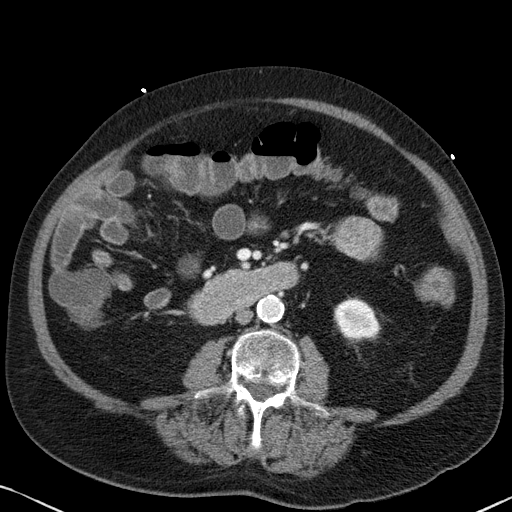
[im 63/95  soft-tissue]
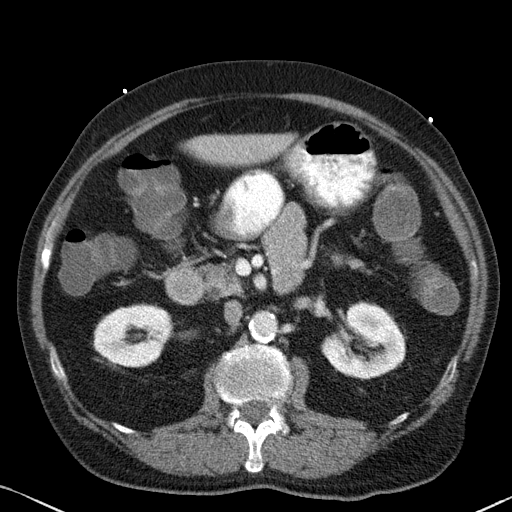
[im 63/95  bone]
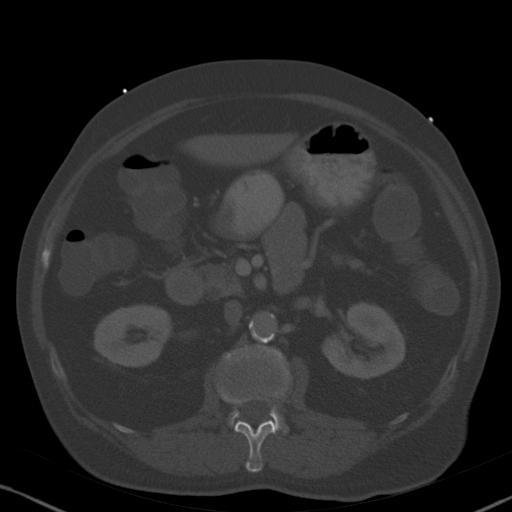
[im 68/95  soft-tissue]
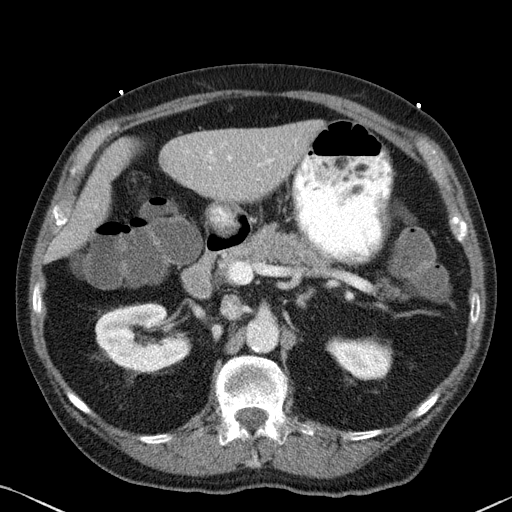
[im 74/95  soft-tissue]
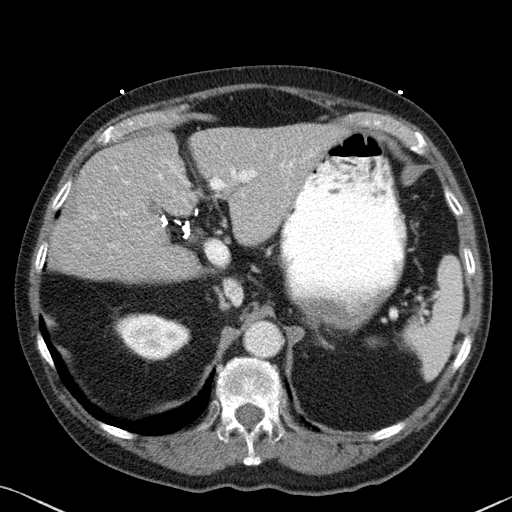
[im 84/95  soft-tissue]
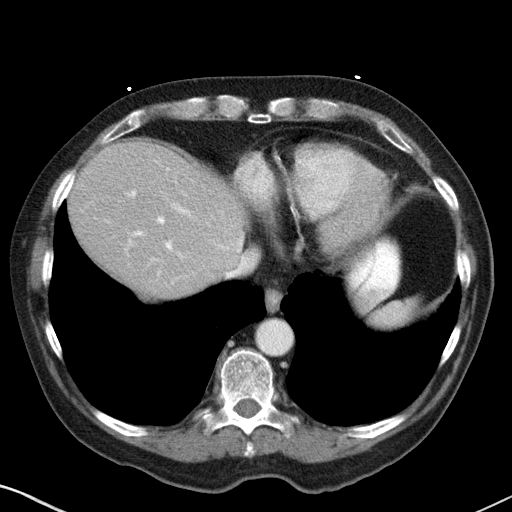
[im 89/95  soft-tissue]
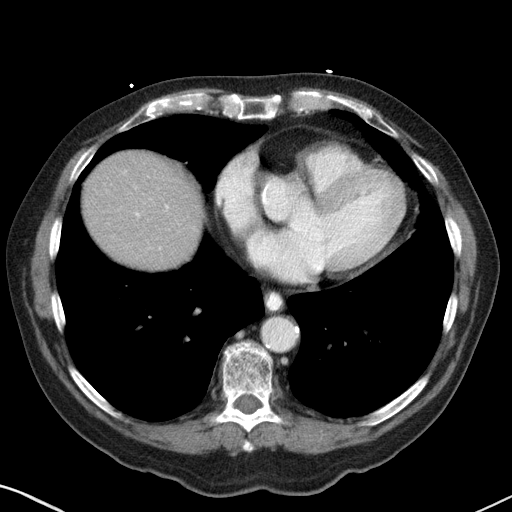

[Series 5: abd/pelvis 3.0 coronal · coronal · 0.82mm/px · 3 of 101 slices shown]
[im 34/101  soft-tissue]
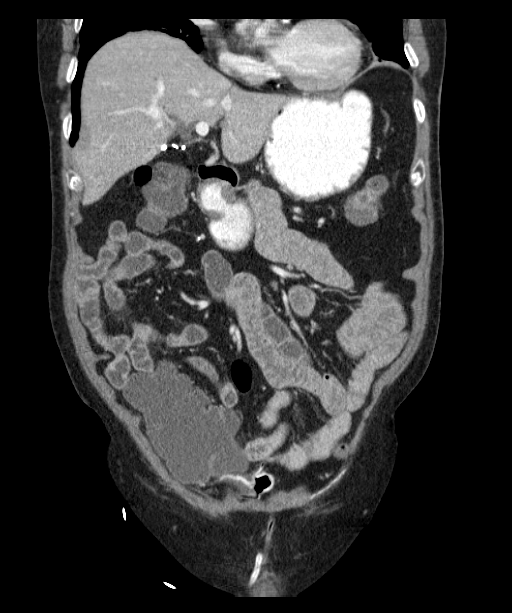
[im 45/101  soft-tissue]
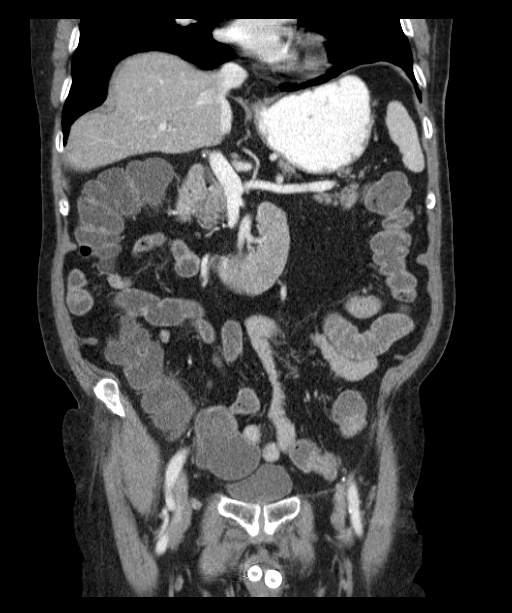
[im 56/101  soft-tissue]
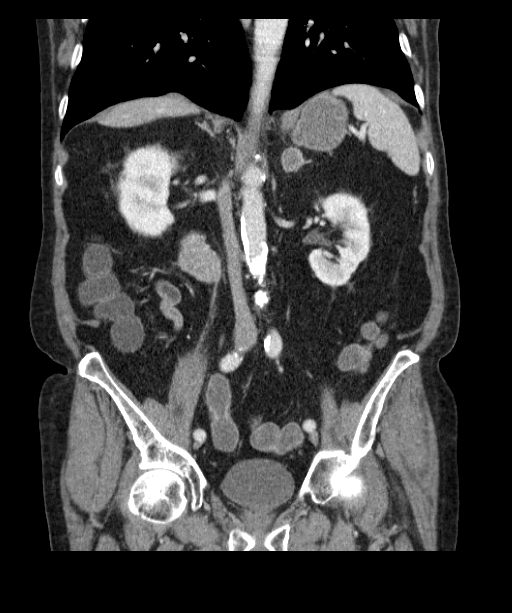

[16 of 46 positions shown; findings below may reference images not displayed]

FINDINGS: Musculoskeletal: Chronic L1 compression fracture. No new compression
fractures compared to prior exam. Lower lumbar spondylosis. Likely
chronic T8 compression fracture.

Lung Bases: Pleural nodularity is present in the RIGHT lower lobe.
This probably represents asbestos related pleural disease but is
incompletely visible. No comparison chest CT is available and these
regions were not seen on previous abdominal CT. Coronary artery
atherosclerosis is present. If office based assessment of coronary
risk factors has not been performed, it is now recommended.

Liver:  Normal.

Spleen:  Normal.

Gallbladder:  Cholecystectomy.

Common bile duct:  Normal.

Pancreas:  Normal.

Adrenal glands:  Unchanged LEFT adrenal myelolipoma.

Kidneys: Renal enhancement and excretion is within normal limits.
LEFT ureter normal. RIGHT ureter normal.

Stomach:  Distended with contrast.  No thickening or obstruction.

Small bowel:  Within normal limits.  No dilation.

Colon: Fluid levels are present throughout the colon. These are
abnormal but nonspecific. No colonic obstruction. No definite mural
thickening.

Pelvic Genitourinary: Penile prosthesis reservoir in the abdomen.
Urinary bladder appears normal.

Peritoneum: No free air.  No free fluid.

Vascular/lymphatic: Atherosclerosis.

Body Wall: Tiny fat containing periumbilical hernia.
IMPRESSION: 1. Areas of RIGHT pleural thickening, suggestive of pleural plaques
but incompletely visible.
2. Atherosclerosis and coronary artery disease.
3. Nonspecific fluid levels throughout the colon. These are most
commonly associated with enteric infection.
4. Unchanged LEFT adrenal myelolipoma.

## 2019-08-19 ENCOUNTER — Ambulatory Visit: Payer: Medicare Other | Attending: Internal Medicine

## 2019-08-19 DIAGNOSIS — Z23 Encounter for immunization: Secondary | ICD-10-CM | POA: Insufficient documentation

## 2019-09-07 ENCOUNTER — Ambulatory Visit: Payer: Medicare Other | Attending: Internal Medicine

## 2019-09-07 DIAGNOSIS — Z23 Encounter for immunization: Secondary | ICD-10-CM | POA: Insufficient documentation

## 2019-09-07 NOTE — Progress Notes (Signed)
   Covid-[facility] Vaccination [facility]  Name:  Jeremy Stuart    MRN: [id number] DOB: June 05, 1930  09/07/2019  Mr. Jeremy Stuart was observed post Covid-19 immunization for 15 minutes without incidence. He was provided with Vaccine Information Sheet and instruction to access the V-Safe system.   Mr. Jeremy Stuart was instructed to call 911 with any severe reactions post vaccine: Marland Kitchen Difficulty breathing  . Swelling of your face and throat  . A fast heartbeat  . A bad rash all over your body  . Dizziness and weakness    Immunizations Administered    Name Date Dose VIS Date Route   Pfizer COVID-19 Vaccine 09/07/2019 12:18 PM 0.3 mL 07/10/2019 Intramuscular   Manufacturer: [employer], [employer]   Lot: [id number]   NDC: [address]-5
